# Patient Record
Sex: Female | Born: 1955 | Race: White | Hispanic: No | Smoking: Current every day smoker
Health system: Southern US, Community
[De-identification: ages and names within clinical notes are randomized; demographics above are authoritative.]

## PROBLEM LIST (undated history)

## (undated) DIAGNOSIS — T7840XA Allergy, unspecified, initial encounter: Secondary | ICD-10-CM

## (undated) DIAGNOSIS — N809 Endometriosis, unspecified: Secondary | ICD-10-CM

## (undated) HISTORY — PX: KNEE ARTHROSCOPY: SUR90

## (undated) HISTORY — DX: Allergy, unspecified, initial encounter: T78.40XA

## (undated) HISTORY — DX: Endometriosis, unspecified: N80.9

## (undated) HISTORY — PX: LAPAROSCOPY: SHX197

## (undated) HISTORY — PX: APPENDECTOMY: SHX54

---

## 1998-03-22 ENCOUNTER — Other Ambulatory Visit: Admission: RE | Admit: 1998-03-22 | Discharge: 1998-03-22 | Payer: Self-pay | Admitting: Obstetrics & Gynecology

## 1998-07-23 ENCOUNTER — Ambulatory Visit (HOSPITAL_COMMUNITY): Admission: RE | Admit: 1998-07-23 | Discharge: 1998-07-23 | Payer: Self-pay | Admitting: Obstetrics & Gynecology

## 1998-11-27 ENCOUNTER — Encounter: Payer: Self-pay | Admitting: Obstetrics & Gynecology

## 1998-11-27 ENCOUNTER — Ambulatory Visit (HOSPITAL_COMMUNITY): Admission: RE | Admit: 1998-11-27 | Discharge: 1998-11-27 | Payer: Self-pay | Admitting: Obstetrics & Gynecology

## 1999-09-04 ENCOUNTER — Other Ambulatory Visit: Admission: RE | Admit: 1999-09-04 | Discharge: 1999-09-04 | Payer: Self-pay | Admitting: Obstetrics & Gynecology

## 2000-03-18 ENCOUNTER — Encounter: Payer: Self-pay | Admitting: Obstetrics & Gynecology

## 2000-03-18 ENCOUNTER — Ambulatory Visit (HOSPITAL_COMMUNITY): Admission: RE | Admit: 2000-03-18 | Discharge: 2000-03-18 | Payer: Self-pay | Admitting: Obstetrics & Gynecology

## 2000-06-15 ENCOUNTER — Encounter: Payer: Self-pay | Admitting: Internal Medicine

## 2000-06-15 ENCOUNTER — Encounter: Admission: RE | Admit: 2000-06-15 | Discharge: 2000-06-15 | Payer: Self-pay | Admitting: Internal Medicine

## 2000-07-15 ENCOUNTER — Encounter: Admission: RE | Admit: 2000-07-15 | Discharge: 2000-07-15 | Payer: Self-pay | Admitting: Internal Medicine

## 2000-07-15 ENCOUNTER — Encounter: Payer: Self-pay | Admitting: Internal Medicine

## 2001-01-29 ENCOUNTER — Other Ambulatory Visit: Admission: RE | Admit: 2001-01-29 | Discharge: 2001-01-29 | Payer: Self-pay | Admitting: Obstetrics & Gynecology

## 2001-06-29 ENCOUNTER — Encounter: Payer: Self-pay | Admitting: Obstetrics & Gynecology

## 2001-06-29 ENCOUNTER — Ambulatory Visit (HOSPITAL_COMMUNITY): Admission: RE | Admit: 2001-06-29 | Discharge: 2001-06-29 | Payer: Self-pay | Admitting: Obstetrics & Gynecology

## 2002-02-18 ENCOUNTER — Other Ambulatory Visit: Admission: RE | Admit: 2002-02-18 | Discharge: 2002-02-18 | Payer: Self-pay | Admitting: Obstetrics and Gynecology

## 2003-01-02 ENCOUNTER — Encounter: Payer: Self-pay | Admitting: Obstetrics & Gynecology

## 2003-01-02 ENCOUNTER — Ambulatory Visit (HOSPITAL_COMMUNITY): Admission: RE | Admit: 2003-01-02 | Discharge: 2003-01-02 | Payer: Self-pay | Admitting: Obstetrics & Gynecology

## 2003-06-27 ENCOUNTER — Other Ambulatory Visit: Admission: RE | Admit: 2003-06-27 | Discharge: 2003-06-27 | Payer: Self-pay | Admitting: Obstetrics & Gynecology

## 2004-05-10 ENCOUNTER — Ambulatory Visit (HOSPITAL_COMMUNITY): Admission: RE | Admit: 2004-05-10 | Discharge: 2004-05-10 | Payer: Self-pay | Admitting: Obstetrics & Gynecology

## 2004-07-22 ENCOUNTER — Other Ambulatory Visit: Admission: RE | Admit: 2004-07-22 | Discharge: 2004-07-22 | Payer: Self-pay | Admitting: Obstetrics & Gynecology

## 2004-11-15 ENCOUNTER — Encounter (INDEPENDENT_AMBULATORY_CARE_PROVIDER_SITE_OTHER): Payer: Self-pay | Admitting: Specialist

## 2004-11-15 ENCOUNTER — Ambulatory Visit (HOSPITAL_COMMUNITY): Admission: RE | Admit: 2004-11-15 | Discharge: 2004-11-15 | Payer: Self-pay | Admitting: Gastroenterology

## 2006-01-20 ENCOUNTER — Other Ambulatory Visit: Admission: RE | Admit: 2006-01-20 | Discharge: 2006-01-20 | Payer: Self-pay | Admitting: Obstetrics & Gynecology

## 2007-10-20 ENCOUNTER — Encounter: Payer: Self-pay | Admitting: Internal Medicine

## 2007-10-27 ENCOUNTER — Encounter: Payer: Self-pay | Admitting: Internal Medicine

## 2007-12-09 ENCOUNTER — Encounter: Payer: Self-pay | Admitting: Internal Medicine

## 2007-12-17 ENCOUNTER — Ambulatory Visit: Payer: Self-pay | Admitting: Internal Medicine

## 2007-12-17 DIAGNOSIS — J309 Allergic rhinitis, unspecified: Secondary | ICD-10-CM | POA: Insufficient documentation

## 2007-12-17 LAB — CONVERTED CEMR LAB
Basophils Absolute: 0.1 10*3/uL (ref 0.0–0.1)
Basophils Relative: 1.6 % — ABNORMAL HIGH (ref 0.0–1.0)
Eosinophils Absolute: 0.3 10*3/uL (ref 0.0–0.6)
HCT: 38.4 % (ref 36.0–46.0)
Hemoglobin: 12.7 g/dL (ref 12.0–15.0)
MCHC: 33.1 g/dL (ref 30.0–36.0)
Monocytes Relative: 6 % (ref 3.0–11.0)
Neutro Abs: 5.4 10*3/uL (ref 1.4–7.7)
Platelets: 338 10*3/uL (ref 150–400)
WBC: 8.1 10*3/uL (ref 4.5–10.5)

## 2007-12-19 DIAGNOSIS — J329 Chronic sinusitis, unspecified: Secondary | ICD-10-CM | POA: Insufficient documentation

## 2007-12-29 ENCOUNTER — Ambulatory Visit: Payer: Self-pay | Admitting: Internal Medicine

## 2008-01-05 ENCOUNTER — Telehealth: Payer: Self-pay | Admitting: Internal Medicine

## 2008-02-01 ENCOUNTER — Ambulatory Visit (HOSPITAL_COMMUNITY): Admission: RE | Admit: 2008-02-01 | Discharge: 2008-02-01 | Payer: Self-pay | Admitting: Gastroenterology

## 2010-05-03 ENCOUNTER — Encounter: Admission: RE | Admit: 2010-05-03 | Discharge: 2010-05-03 | Payer: Self-pay | Admitting: Internal Medicine

## 2010-07-22 ENCOUNTER — Ambulatory Visit (HOSPITAL_COMMUNITY): Admission: RE | Admit: 2010-07-22 | Discharge: 2010-07-22 | Payer: Self-pay | Admitting: Obstetrics & Gynecology

## 2011-06-26 ENCOUNTER — Other Ambulatory Visit (HOSPITAL_COMMUNITY): Payer: Self-pay | Admitting: Obstetrics & Gynecology

## 2011-06-26 DIAGNOSIS — Z1231 Encounter for screening mammogram for malignant neoplasm of breast: Secondary | ICD-10-CM

## 2011-07-24 ENCOUNTER — Ambulatory Visit (HOSPITAL_COMMUNITY)
Admission: RE | Admit: 2011-07-24 | Discharge: 2011-07-24 | Disposition: A | Discharge status: Home / Self Care | Payer: BC Managed Care – PPO | Source: Ambulatory Visit | Attending: Obstetrics & Gynecology | Admitting: Obstetrics & Gynecology

## 2011-07-24 DIAGNOSIS — Z1231 Encounter for screening mammogram for malignant neoplasm of breast: Secondary | ICD-10-CM | POA: Insufficient documentation

## 2011-08-14 ENCOUNTER — Other Ambulatory Visit: Payer: Self-pay | Admitting: Internal Medicine

## 2011-08-14 ENCOUNTER — Ambulatory Visit
Admission: RE | Admit: 2011-08-14 | Discharge: 2011-08-14 | Disposition: A | Discharge status: Home / Self Care | Payer: BC Managed Care – PPO | Source: Ambulatory Visit | Attending: Internal Medicine | Admitting: Internal Medicine

## 2011-08-14 DIAGNOSIS — R52 Pain, unspecified: Secondary | ICD-10-CM

## 2011-11-20 ENCOUNTER — Ambulatory Visit (INDEPENDENT_AMBULATORY_CARE_PROVIDER_SITE_OTHER): Payer: BC Managed Care – PPO | Admitting: Family Medicine

## 2011-11-20 DIAGNOSIS — B9789 Other viral agents as the cause of diseases classified elsewhere: Secondary | ICD-10-CM

## 2011-11-20 DIAGNOSIS — B349 Viral infection, unspecified: Secondary | ICD-10-CM

## 2011-11-20 DIAGNOSIS — J45909 Unspecified asthma, uncomplicated: Secondary | ICD-10-CM

## 2011-11-20 DIAGNOSIS — J683 Other acute and subacute respiratory conditions due to chemicals, gases, fumes and vapors: Secondary | ICD-10-CM

## 2011-11-20 MED ORDER — AZITHROMYCIN 250 MG PO TABS: ORAL_TABLET | ORAL | Status: AC

## 2011-11-20 MED ORDER — FLUTICASONE PROPIONATE 50 MCG/ACT NA SUSP: 2.0000 | Freq: Every day | NASAL | Status: DC

## 2011-11-20 MED ORDER — BENZONATATE 100 MG PO CAPS: 200.0000 mg | ORAL_CAPSULE | Freq: Two times a day (BID) | ORAL | Status: AC | PRN

## 2011-11-20 NOTE — Progress Notes (Deleted)
  Subjective:    Patient ID: Natasha Johnson, female    DOB: [birth year], 56 y.o.   MRN: [id number]  Cough      Review of Systems  Respiratory: Positive for cough.        Objective:   Physical Exam        Assessment & Plan:

## 2011-11-20 NOTE — Progress Notes (Signed)
  Subjective:    Patient ID: Natasha Johnson, female    DOB: 03-18-1956, 56 y.o.   MRN: [id number]  HPI  Patient presents with 5 days history of low grade fever, chills, copious rhinorrhea and cough. Cough is worse at night.   PMH/ Recurrent sinusitis           Allergies  Review of Systems  Constitutional: Negative for fever and chills.  HENT: Positive for congestion and rhinorrhea.   Respiratory: Positive for cough. Negative for wheezing.        Objective:   Physical Exam  Constitutional: She is oriented to person, place, and time. She appears well-developed and well-nourished.  HENT:  Head: Normocephalic and atraumatic.  Nose: Rhinorrhea present.  Mouth/Throat: Posterior oropharyngeal erythema present.  Neck: Neck supple.  Pulmonary/Chest: Effort normal and breath sounds normal.  Neurological: She is alert and oriented to person, place, and time.  Skin: Skin is warm.          Assessment & Plan:   1. Infection viral    2. Reactive airways disease  azithromycin (ZITHROMAX) 250 MG tablet, fluticasone (FLONASE) 50 MCG/ACT nasal spray, benzonatate (TESSALON) 100 MG capsule   Anticipatory guidance

## 2012-02-27 ENCOUNTER — Other Ambulatory Visit: Payer: Self-pay | Admitting: Family Medicine

## 2012-02-27 DIAGNOSIS — J683 Other acute and subacute respiratory conditions due to chemicals, gases, fumes and vapors: Secondary | ICD-10-CM

## 2012-02-27 MED ORDER — FLUTICASONE PROPIONATE 50 MCG/ACT NA SUSP: 2.0000 | Freq: Every day | NASAL | Status: DC

## 2012-03-01 ENCOUNTER — Other Ambulatory Visit: Payer: Self-pay | Admitting: Physician Assistant

## 2012-03-01 DIAGNOSIS — J683 Other acute and subacute respiratory conditions due to chemicals, gases, fumes and vapors: Secondary | ICD-10-CM

## 2012-03-01 MED ORDER — FLUTICASONE PROPIONATE 50 MCG/ACT NA SUSP: 2.0000 | Freq: Every day | NASAL | Status: DC

## 2013-01-28 ENCOUNTER — Other Ambulatory Visit: Payer: Self-pay | Admitting: Obstetrics & Gynecology

## 2013-01-28 DIAGNOSIS — R928 Other abnormal and inconclusive findings on diagnostic imaging of breast: Secondary | ICD-10-CM

## 2013-02-08 ENCOUNTER — Ambulatory Visit
Admission: RE | Admit: 2013-02-08 | Discharge: 2013-02-08 | Disposition: A | Discharge status: Home / Self Care | Payer: BC Managed Care – PPO | Source: Ambulatory Visit | Attending: Obstetrics & Gynecology | Admitting: Obstetrics & Gynecology

## 2013-02-08 DIAGNOSIS — R928 Other abnormal and inconclusive findings on diagnostic imaging of breast: Secondary | ICD-10-CM

## 2013-07-05 ENCOUNTER — Other Ambulatory Visit: Payer: Self-pay | Admitting: Obstetrics & Gynecology

## 2013-07-05 DIAGNOSIS — N644 Mastodynia: Secondary | ICD-10-CM

## 2013-07-20 ENCOUNTER — Ambulatory Visit
Admission: RE | Admit: 2013-07-20 | Discharge: 2013-07-20 | Disposition: A | Discharge status: Home / Self Care | Payer: 59 | Source: Ambulatory Visit | Attending: Obstetrics & Gynecology | Admitting: Obstetrics & Gynecology

## 2013-07-20 DIAGNOSIS — N644 Mastodynia: Secondary | ICD-10-CM

## 2013-08-10 ENCOUNTER — Ambulatory Visit (INDEPENDENT_AMBULATORY_CARE_PROVIDER_SITE_OTHER): Payer: 59 | Admitting: Podiatry

## 2013-08-10 ENCOUNTER — Encounter: Payer: Self-pay | Admitting: Podiatry

## 2013-08-10 VITALS — BP 132/75 | HR 67 | Ht 63.75 in | Wt 135.0 lb

## 2013-08-10 DIAGNOSIS — M21969 Unspecified acquired deformity of unspecified lower leg: Secondary | ICD-10-CM | POA: Insufficient documentation

## 2013-08-10 DIAGNOSIS — M79609 Pain in unspecified limb: Secondary | ICD-10-CM

## 2013-08-10 DIAGNOSIS — M79674 Pain in right toe(s): Secondary | ICD-10-CM

## 2013-08-10 MED ORDER — ARCH BANDAGE MISC: 1.0000 | Freq: Once | Status: AC

## 2013-08-10 NOTE — Progress Notes (Signed)
Subjective: 57 year old female presents complaining of pain on right great toe x 1 month. Initially it was very painful and was like a soft cyst. Now it is more firm and less painful. She also had some medial TNJ area pain two times since last year. Stated that she exercises in Zumba class.   Review of Systems - General ROS: negative for - chills, fatigue, fever, night sweats, sleep disturbance, weight gain or weight loss Ophthalmic ROS: negative ENT ROS: negative Allergy and Immunology ROS: Seasonal. Takes Alegra that helps. Breast ROS: negative Respiratory ROS: no cough, shortness of breath, or wheezing Cardiovascular ROS: no chest pain or dyspnea on exertion Gastrointestinal ROS: no abdominal pain, change in bowel habits, or black or bloody stools Musculoskeletal ROS: negative other than foot pain. Neurological ROS: negative Dermatological ROS: Has dry skin all over.   Objective: Vascular status are within normal. No edema or erythema noted.  Right great toe affected area show no redness or visible edema and is similar in texture and size when compared with the contralateral side.  Dermatologic: Normotrophic skin without visible skin lesions. Orthopedic: Significant weakness and dorsal displacement of the first Metatarsal bone upon loading of forefoot R>L. Positive of contracting lesser digits in stance position right>left. Hyper-pronating Subtalar joint upon weight bearing R>L.  Neurologic: All epicritic and tactile sensations grossly intact.   Assessment: Hypermobile first ray with unstable first Metatarsocuneiform joint right>left. Excess gripping of the 2nd digit upon the lateral aspect of the the right hallux, pressing on lateral side of the hallux.  Faulty biomechanics, Subtalar joint hyper pronation with weak first ray and contracting lesser digits.  Plan: Reviewed clinical findings and available treatment options.  Metatarsal binder dispensed to provide added stability of the  first ray, and coban wrap of the right hallux to prevent it from being squeezed by the 2nd digit during weight bearing.  Orthotics will also benefit to address abnormal biomechanics of lower limbs.  Patient will return for the orthotics.

## 2013-08-10 NOTE — Patient Instructions (Signed)
Seen for right great toe pain. Noted of unstable first Metatarsal bone.  May benefit from Metatarsal binder to stabilize the base of the first metatarsal bone and custom made orthotics.  Will contact patient to prepare for orthotics.

## 2013-09-03 ENCOUNTER — Ambulatory Visit (INDEPENDENT_AMBULATORY_CARE_PROVIDER_SITE_OTHER): Payer: 59 | Admitting: Physician Assistant

## 2013-09-03 VITALS — BP 120/74 | HR 79 | Temp 98.4°F | Resp 16 | Ht 63.5 in | Wt 138.0 lb

## 2013-09-03 DIAGNOSIS — R05 Cough: Secondary | ICD-10-CM

## 2013-09-03 DIAGNOSIS — R059 Cough, unspecified: Secondary | ICD-10-CM

## 2013-09-03 DIAGNOSIS — J329 Chronic sinusitis, unspecified: Secondary | ICD-10-CM

## 2013-09-03 MED ORDER — HYDROCOD POLST-CHLORPHEN POLST 10-8 MG/5ML PO LQCR: 5.0000 mL | Freq: Two times a day (BID) | ORAL | Status: DC | PRN

## 2013-09-03 MED ORDER — IPRATROPIUM BROMIDE 0.03 % NA SOLN: 2.0000 | Freq: Two times a day (BID) | NASAL | Status: DC

## 2013-09-03 MED ORDER — CLARITHROMYCIN ER 500 MG PO TB24: 1000.0000 mg | ORAL_TABLET | Freq: Every day | ORAL | Status: DC

## 2013-09-03 NOTE — Patient Instructions (Signed)
Get plenty of rest and drink at least 64 ounces of water daily. Continue the Mucinex. 

## 2013-09-03 NOTE — Progress Notes (Signed)
   Subjective:    Patient ID: Natasha Johnson, female    DOB: 20-Jul-1956, 57 y.o.   MRN: [id number]  PCP: Alva Garnet., MD  Chief Complaint  Patient presents with  . Cough    all started Tuesday  . Headache  . Fever    low-grade  . Generalized Body Aches   Medications, allergies, past medical history, surgical history, family history, social history and problem list reviewed and updated.   HPI  All summer I had a lot of mucous and allergies.  With an insurance change, the medication she was taking for it became unaffordable.  ALlegra wasn't very helpful.  In October at her CPE, she received a dose of Kenalog IM with good results.  2 weeks ago she was exposed to CO due to a leak in the fireplace.  On 08/30/2013, she developed a ST, drainage, generally felt bad.  Then improved for a day or so.  Yesterday, started feeling bad again. Seemed to come on fast-runny nose, congestion, laryngitis, "feeling like crap again."  Did not receive a flu vaccine this season, "And I don't want one."  Review of Systems As above.    Objective:   Physical Exam Blood pressure 120/74, pulse 79, temperature 98.4 F (36.9 C), temperature source Oral, resp. rate 16, height 5' 3.5" (1.613 m), weight 138 lb (62.596 kg), SpO2 96.00%. Body mass index is 24.06 kg/(m^2). Well-developed, well nourished WF who is awake, alert and oriented, in NAD. HEENT: [place]/AT, PERRL, EOMI.  Sclera and conjunctiva are clear.  EAC are patent, TMs are normal in appearance. Nasal mucosa is pink and moist. OP is clear. Neck: supple, non-tender, no lymphadenopathy, thyromegaly. Heart: RRR, no murmur Lungs: normal effort, CTA; cough during exam is non-productive. Extremities: no cyanosis, clubbing or edema. Skin: warm and dry without rash. Psychologic: good mood and appropriate affect, normal speech and behavior.   Results for orders placed in visit on 09/03/13  POCT INFLUENZA A/B      Result Value Range   Influenza A, POC Negative     Influenza B, POC Negative          Assessment & Plan:  1. Sinusitis Rest, fluids. Anticipatory guidance.  Continue Mucinex. RTC or see PCP if symptoms worsen/persist. - POCT Influenza A/B - clarithromycin (BIAXIN XL) 500 MG 24 hr tablet; Take 2 tablets (1,000 mg total) by mouth daily.  Dispense: 20 tablet; Refill: 0 - ipratropium (ATROVENT) 0.03 % nasal spray; Place 2 sprays into the nose 2 (two) times daily.  Dispense: 30 mL; Refill: 0  2. Cough - chlorpheniramine-HYDROcodone (TUSSIONEX PENNKINETIC ER) 10-8 MG/5ML LQCR; Take 5 mLs by mouth every 12 (twelve) hours as needed for cough (cough).  Dispense: 100 mL; Refill: 0   Fernande Bras, PA-C Physician Assistant-Certified [facility]

## 2013-09-08 ENCOUNTER — Ambulatory Visit
Admission: RE | Admit: 2013-09-08 | Discharge: 2013-09-08 | Disposition: A | Discharge status: Home / Self Care | Payer: 59 | Source: Ambulatory Visit | Attending: Internal Medicine | Admitting: Internal Medicine

## 2013-09-08 ENCOUNTER — Other Ambulatory Visit: Payer: Self-pay | Admitting: Internal Medicine

## 2013-09-08 DIAGNOSIS — R0602 Shortness of breath: Secondary | ICD-10-CM

## 2013-12-19 ENCOUNTER — Ambulatory Visit: Payer: 59

## 2013-12-19 ENCOUNTER — Ambulatory Visit (INDEPENDENT_AMBULATORY_CARE_PROVIDER_SITE_OTHER): Payer: 59 | Admitting: Family Medicine

## 2013-12-19 VITALS — BP 138/84 | HR 77 | Temp 98.4°F | Resp 16 | Ht 63.5 in | Wt 140.0 lb

## 2013-12-19 DIAGNOSIS — R079 Chest pain, unspecified: Secondary | ICD-10-CM

## 2013-12-19 DIAGNOSIS — R631 Polydipsia: Secondary | ICD-10-CM

## 2013-12-19 DIAGNOSIS — R0781 Pleurodynia: Secondary | ICD-10-CM

## 2013-12-19 DIAGNOSIS — R3589 Other polyuria: Secondary | ICD-10-CM

## 2013-12-19 DIAGNOSIS — R42 Dizziness and giddiness: Secondary | ICD-10-CM

## 2013-12-19 DIAGNOSIS — R358 Other polyuria: Secondary | ICD-10-CM

## 2013-12-19 DIAGNOSIS — T148XXA Other injury of unspecified body region, initial encounter: Secondary | ICD-10-CM

## 2013-12-19 LAB — POCT CBC
Granulocyte percent: 73.7 %G (ref 37–80)
HCT, POC: 41 % (ref 37.7–47.9)
Hemoglobin: 13.2 g/dL (ref 12.2–16.2)
Lymph, poc: 1.8 (ref 0.6–3.4)
MCH, POC: 30.3 pg (ref 27–31.2)
MCHC: 32.2 g/dL (ref 31.8–35.4)
MCV: 94 fL (ref 80–97)
MID (cbc): 0.5 (ref 0–0.9)
MPV: 7.9 fL (ref 0–99.8)
POC Granulocyte: 6.3 (ref 2–6.9)
POC LYMPH PERCENT: 21 %L (ref 10–50)
POC MID %: 5.3 %M (ref 0–12)
Platelet Count, POC: 289 10*3/uL (ref 142–424)
RBC: 4.36 M/uL (ref 4.04–5.48)
RDW, POC: 13.4 %
WBC: 8.6 10*3/uL (ref 4.6–10.2)

## 2013-12-19 LAB — COMPREHENSIVE METABOLIC PANEL
ALT: 14 U/L (ref 0–35)
AST: 18 U/L (ref 0–37)
Albumin: 4.7 g/dL (ref 3.5–5.2)
Alkaline Phosphatase: 101 U/L (ref 39–117)
BUN: 9 mg/dL (ref 6–23)
CO2: 29 mEq/L (ref 19–32)
Calcium: 9.6 mg/dL (ref 8.4–10.5)
Chloride: 99 mEq/L (ref 96–112)
Creat: 0.62 mg/dL (ref 0.50–1.10)
Glucose, Bld: 105 mg/dL — ABNORMAL HIGH (ref 70–99)
Potassium: 4 mEq/L (ref 3.5–5.3)
Sodium: 139 mEq/L (ref 135–145)
Total Bilirubin: 0.5 mg/dL (ref 0.2–1.2)
Total Protein: 7.2 g/dL (ref 6.0–8.3)

## 2013-12-19 LAB — POCT UA - MICROSCOPIC ONLY
Bacteria, U Microscopic: NEGATIVE
Casts, Ur, LPF, POC: NEGATIVE
Crystals, Ur, HPF, POC: NEGATIVE
Epithelial cells, urine per micros: NEGATIVE
Mucus, UA: NEGATIVE
WBC, Ur, HPF, POC: NEGATIVE
Yeast, UA: NEGATIVE

## 2013-12-19 LAB — POCT URINALYSIS DIPSTICK
Bilirubin, UA: NEGATIVE
Glucose, UA: NEGATIVE
Ketones, UA: NEGATIVE
Leukocytes, UA: NEGATIVE
Nitrite, UA: NEGATIVE
Protein, UA: NEGATIVE
Spec Grav, UA: 1.01
Urobilinogen, UA: 0.2
pH, UA: 7

## 2013-12-19 LAB — POCT GLYCOSYLATED HEMOGLOBIN (HGB A1C): Hemoglobin A1C: 5.1

## 2013-12-19 LAB — GLUCOSE, POCT (MANUAL RESULT ENTRY): POC Glucose: 104 mg/dL — AB (ref 70–99)

## 2013-12-19 NOTE — Progress Notes (Signed)
Chief Complaint:  Chief Complaint  Patient presents with  . Rib Injury    Right ribs-herad a pop Fri while working out and it's been hurting ever since  . Dizziness    when switching positions x 3 weeks    HPI: Natasha Johnson is a 58 y.o. female who is here for: 1.  Right rib pain x 2-3 days, she was in the gym adn was laying down and pushing up she heard her ribs pop and now it is very sore. She has not been coughin too much but she has had some coughing and sinus draiange. No history of osteoporosis. No prior broken bones.  She has tried advil with some releif. She has no SOB but she has pleuritic CP with deep breaths  2. She has ahd dizziness for the last 3 weeks. Starts only when she gets up out of bed. She is working out and she gets dizzy. But sh only gets it when she is standing  Up or getting from seated position to standing position. She does not have HA/n/v/vision changes, CP, palpitations with this or with ROM of her head. She is not confused or have slurred speech.   Past Medical History  Diagnosis Date  . Allergy   . Endometriosis    Past Surgical History  Procedure Laterality Date  . Appendectomy    . Knee arthroscopy Left   . Laparoscopy      endometriosis   History   Social History  . Marital Status: Significant Other    Spouse Name: Alecia Lemming    Number of Children: 0  . Years of Education: College   Occupational History  . RESEARCH    Social History Main Topics  . Smoking status: Former Games developer  . Smokeless tobacco: Never Used  . Alcohol Use: 1.8 - 2.4 oz/week    3-4 Glasses of wine per week     Comment: Red Wine  . Drug Use: No  . Sexual Activity: None   Other Topics Concern  . None   Social History Narrative   Lives with Alecia Lemming.   Family History  Problem Relation Age of Onset  . Hypertension Father    Allergies  Allergen Reactions  . Ciprocin-Fluocin-Procin [Fluocinolone Acetonide] Hives  . Penicillins   . Sulfonamide  Derivatives    Prior to Admission medications   Medication Sig Start Date End Date Taking? Authorizing Provider  ALPRAZolam Prudy Feeler) 0.5 MG tablet  07/31/13  Yes Historical Provider, MD  benzonatate (TESSALON) 100 MG capsule  07/14/13  Yes Historical Provider, MD  Multiple Vitamins-Minerals (MULTIVITAMIN WITH MINERALS) tablet Take 1 tablet by mouth daily.   Yes Historical Provider, MD  sertraline (ZOLOFT) 20 MG/ML concentrated solution Take 25 mg by mouth daily.   Yes Historical Provider, MD  ursodiol (ACTIGALL) 250 MG tablet Take 250 mg by mouth 3 (three) times daily.   Yes Historical Provider, MD     ROS: The patient denies fevers, chills, night sweats, unintentional weight loss, chest pain, palpitations, wheezing, dyspnea on exertion, nausea, vomiting, abdominal pain, dysuria, hematuria, melena, numbness, weakness, or tingling.   All other systems have been reviewed and were otherwise negative with the exception of those mentioned in the HPI and as above.    PHYSICAL EXAM: Filed Vitals:   12/19/13 1419  BP: 138/84  Pulse: 77  Temp:   Resp:    Filed Vitals:   12/19/13 1334  Height: 5' 3.5" (1.613 m)  Weight: 140  lb (63.504 kg)   Body mass index is 24.41 kg/(m^2).  General: Alert, no acute distress HEENT:  Normocephalic, atraumatic, oropharynx patent. EOMI, PERRLA Cardiovascular:  Regular rate and rhythm, no rubs murmurs or gallops.  No Carotid bruits, radial pulse intact. No pedal edema.  Respiratory: Clear to auscultation bilaterally.  No wheezes, rales, or rhonchi.  No cyanosis, no use of accessory musculature GI: No organomegaly, abdomen is soft and non-tender, positive bowel sounds.  No masses. Skin: No rashes. Neurologic: Facial musculature symmetric. Psychiatric: Patient is appropriate throughout our interaction. Lymphatic: No cervical lymphadenopathy Musculoskeletal: Gait intact. + tenderness on lower right ribs  LABS: Results for orders placed in visit on 12/19/13    POCT CBC      Result Value Ref Range   WBC 8.6  4.6 - 10.2 K/uL   Lymph, poc 1.8  0.6 - 3.4   POC LYMPH PERCENT 21.0  10 - 50 %L   MID (cbc) 0.5  0 - 0.9   POC MID % 5.3  0 - 12 %M   POC Granulocyte 6.3  2 - 6.9   Granulocyte percent 73.7  37 - 80 %G   RBC 4.36  4.04 - 5.48 M/uL   Hemoglobin 13.2  12.2 - 16.2 g/dL   HCT, POC 78.241.0  95.637.7 - 47.9 %   MCV 94.0  80 - 97 fL   MCH, POC 30.3  27 - 31.2 pg   MCHC 32.2  31.8 - 35.4 g/dL   RDW, POC 21.313.4     Platelet Count, POC 289  142 - 424 K/uL   MPV 7.9  0 - 99.8 fL  POCT UA - MICROSCOPIC ONLY      Result Value Ref Range   WBC, Ur, HPF, POC neg     RBC, urine, microscopic 0-1     Bacteria, U Microscopic neg     Mucus, UA neg     Epithelial cells, urine per micros neg     Crystals, Ur, HPF, POC neg     Casts, Ur, LPF, POC neg     Yeast, UA neg    POCT URINALYSIS DIPSTICK      Result Value Ref Range   Color, UA lt yellow     Clarity, UA clear     Glucose, UA neg     Bilirubin, UA neg     Ketones, UA neg     Spec Grav, UA 1.010     Blood, UA trace-lysed     pH, UA 7.0     Protein, UA neg     Urobilinogen, UA 0.2     Nitrite, UA neg     Leukocytes, UA Negative    POCT GLYCOSYLATED HEMOGLOBIN (HGB A1C)      Result Value Ref Range   Hemoglobin A1C 5.1    GLUCOSE, POCT (MANUAL RESULT ENTRY)      Result Value Ref Range   POC Glucose 104 (*) 70 - 99 mg/dl     EKG/XRAY:   Primary read [facility]. Abnormal ribs unchanged from prior xays but uncertain if acute fracture present Chest neg for pneumothorax, no effusion or infiltrates   ASSESSMENT/PLAN: Encounter Diagnoses  Name Primary?  . Rib pain on right side Yes  . Polydipsia   . Polyuria   . Dizziness and giddiness   . Contusion of unspecified site    Orthostatics nl Labs in house normal Postural Hypotension, precautiosn given avoid sudafed for time being Sample  of nasonex given Will f/u with send out labs Will await for official xray results    Gross sideeffects, risk and benefits, and alternatives of medications d/w patient. Patient is aware that all medications have potential sideeffects and we are unable to predict every sideeffect or drug-drug interaction that may occur.  Treniyah Lynn PHUONG, DO 12/19/2013 3:11 PM  Left message regarding xray results.  If she wants something stronger than tylenol or ibuprofen then can write for norco   CLINICAL DATA: Right rib pain.  EXAM:  RIGHT RIBS - 2 VIEW  COMPARISON: Chest x-ray 09/08/2013.  FINDINGS:  The cardiac silhouette, mediastinal and hilar contours are within  normal limits and stable. There are chronic lung changes with  probable emphysema and pulmonary scarring. No infiltrates, edema or  effusions. There is a nondisplaced fracture involving the anterior  aspect of the eighth rib. No other definite acute rib fractures.  IMPRESSION:  No acute cardiopulmonary findings.  Right eighth anterior rib fracture.

## 2013-12-21 ENCOUNTER — Telehealth: Payer: Self-pay

## 2013-12-21 DIAGNOSIS — S2249XA Multiple fractures of ribs, unspecified side, initial encounter for closed fracture: Secondary | ICD-10-CM

## 2013-12-21 NOTE — Telephone Encounter (Signed)
Spoke to patient about labs, xrays and also what she needs to do for rib fracture. Will order bone density scan.

## 2013-12-21 NOTE — Telephone Encounter (Signed)
PT STATES SHE WAS SEEN FOR A RIB FRACTURE BUT WASN'T GIVEN ANY INSTRUCTIONS AS HOW TO TAKE CARE OF IT OR WHAT TO DO.  PLEASE CALL [phone]

## 2013-12-26 ENCOUNTER — Telehealth: Payer: Self-pay

## 2013-12-26 NOTE — Telephone Encounter (Signed)
Seen by Dr. Conley RollsLe; will forward to Dr. Conley RollsLe.

## 2013-12-26 NOTE — Telephone Encounter (Signed)
Patient Responses     Chl Mychart After Visit Questionnaire    Question 12/26/2013 3:58 PM   How are you feeling after your recent visit? Not good-fractured rib   Does the recommended course of treatment seem to be helping your symptoms? There is no real treatment   Are you experiencing any side effects from your recommended treatment?    is there anything else you would like to ask your physician? I don't like tylenol. is there another good pain pill that would help.

## 2014-01-01 ENCOUNTER — Other Ambulatory Visit: Payer: Self-pay | Admitting: Family Medicine

## 2014-01-01 MED ORDER — HYDROCODONE-ACETAMINOPHEN 5-325 MG PO TABS: 1.0000 | ORAL_TABLET | Freq: Three times a day (TID) | ORAL | Status: DC | PRN

## 2014-01-01 NOTE — Telephone Encounter (Signed)
Pt.notified

## 2014-01-01 NOTE — Telephone Encounter (Signed)
Can you call her and let her know that I will have a prescription for Norco for her to pick up after 10 am on 01/01/14. It has tylenol in it along with a codeine derivate. She can take it every 8 hrs as needed, no other tylenol with this medication. Take ibuprofen prn and I also suggest that she can take a xanax since she already has if needed for relaxation/muscle spasm. I do not want to overmedicate her. If she needs additional muscle relaxer then I can give it to her. Major Sideeffects for norco include stomach upset, constipation and also CNS and  respiratory depression among other things. If she has a substance abuse issues then this is probably not a great drug for her. Thanks Tle

## 2014-03-17 ENCOUNTER — Ambulatory Visit (INDEPENDENT_AMBULATORY_CARE_PROVIDER_SITE_OTHER): Payer: 59 | Admitting: Family Medicine

## 2014-03-17 VITALS — BP 128/82 | HR 78 | Temp 98.4°F | Resp 16 | Ht 63.5 in | Wt 141.2 lb

## 2014-03-17 DIAGNOSIS — R0781 Pleurodynia: Secondary | ICD-10-CM

## 2014-03-17 DIAGNOSIS — R059 Cough, unspecified: Secondary | ICD-10-CM

## 2014-03-17 DIAGNOSIS — R05 Cough: Secondary | ICD-10-CM

## 2014-03-17 DIAGNOSIS — R079 Chest pain, unspecified: Secondary | ICD-10-CM

## 2014-03-17 MED ORDER — AZITHROMYCIN 250 MG PO TABS: ORAL_TABLET | ORAL | Status: DC

## 2014-03-17 MED ORDER — HYDROCODONE-HOMATROPINE 5-1.5 MG/5ML PO SYRP: 5.0000 mL | ORAL_SOLUTION | ORAL | Status: DC | PRN

## 2014-03-17 MED ORDER — BENZONATATE 100 MG PO CAPS: 100.0000 mg | ORAL_CAPSULE | Freq: Three times a day (TID) | ORAL | Status: DC | PRN

## 2014-03-17 NOTE — Progress Notes (Signed)
Subjective: Patient has been sick for several days with a head congestion, sore throat, and persistent cough. She had a rib fracture several months ago, and is very reluctant to keep on coughing like this for fear that she will reenter the rib. She is continued to have some pain in the lateral chest wall on the right. She does not smoke. She works at a computer all day. She never got her bone density scan done. For some reason she never got word as to what it was to be done. She will get one at her GYN appointment in 2 weeks.  Objective: Pleasant lady in no major distress except she is sore in her chest wall especially cough. Her TMs are normal. Throat erythematous without exudate. Neck supple without any significant nodes. Chest clear to auscultation. Mild right lateral chest wall tenderness. Heart regular without murmurs.  Assessment: URI and cough Chest wall pain Recent fractured ribs  Plan: Azithromycin, Hycodan, and Tessalon. Return if not improving.

## 2014-03-17 NOTE — Patient Instructions (Signed)
Drink plenty of fluids to help keep your secretions TM.   Take the azithromycin 2 pills today, then one daily for 4 days  Take the Tessalon one or 2 pills 3 times daily as needed for cough. This is generally not very sedating.  Use the hydrocodone cough syrup 1 teaspoon every 4-6 hours as needed for cough, does tend to sedate  If getting worse such as more short of breath, more pain, or more fever please return

## 2014-10-05 ENCOUNTER — Ambulatory Visit: Payer: 59 | Admitting: Podiatry

## 2014-10-17 ENCOUNTER — Ambulatory Visit (INDEPENDENT_AMBULATORY_CARE_PROVIDER_SITE_OTHER): Payer: 59

## 2014-10-17 ENCOUNTER — Ambulatory Visit (INDEPENDENT_AMBULATORY_CARE_PROVIDER_SITE_OTHER): Payer: 59 | Admitting: Podiatry

## 2014-10-17 VITALS — BP 140/85 | HR 72 | Resp 16 | Ht 63.75 in | Wt 145.0 lb

## 2014-10-17 DIAGNOSIS — M9271 Juvenile osteochondrosis of metatarsus, right foot: Secondary | ICD-10-CM

## 2014-10-17 DIAGNOSIS — S90121A Contusion of right lesser toe(s) without damage to nail, initial encounter: Secondary | ICD-10-CM

## 2014-10-17 NOTE — Progress Notes (Signed)
   Subjective:    Patient ID: Natasha Johnson, female    DOB: [birth year], 59 y.o.   MRN: [id number]  HPI Comments: Injured the second toe on the right foot, it has been xrayed twice and had a ct scan and they can not find anything wrong with it , tripped on a throw rug and hit the toe into a chair on July 13th      Review of Systems  All other systems reviewed and are negative.      Objective:   Physical Exam: I have reviewed her past mental history medications allergy surgery social history and review of systems. Pulses are strongly palpable bilateral. Neurologic sensorium is intact per Semmes Weinstein monofilament. Deep tendon reflexes are intact bilateral and muscle strength was 5 over 5 dorsiflexion plantar flexors and inverters everters all into the musculature is intact. [facility] joints distal to the ankle have a full range of motion without crepitation. She has pain on palpation and range of motion of the second metatarsophalangeal joint of the right foot which does demonstrate some mild edema and thickness in the area. Radiographic evaluation does demonstrate a Freiberg's fracture also a compression fracture of the PIPJ second digit right. I also evaluated her second metatarsophalangeal joint on CT scan.        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  Plan: Discussed etiology pathology conservative versus surgical therapies. Discussed arthroplasty with interposition of the tendon for surgical correction. I also offered her an injection at the metatarsophalangeal joint which she took advantage of today with dexamethasone and local anesthetic. This was prepped with Betadine and local anesthetic and I will follow-up with her in 1 month.

## 2014-10-31 ENCOUNTER — Ambulatory Visit (INDEPENDENT_AMBULATORY_CARE_PROVIDER_SITE_OTHER): Payer: 59 | Admitting: Podiatry

## 2014-10-31 VITALS — BP 127/85 | HR 84 | Resp 16

## 2014-10-31 DIAGNOSIS — M76821 Posterior tibial tendinitis, right leg: Secondary | ICD-10-CM

## 2014-10-31 DIAGNOSIS — M6789 Other specified disorders of synovium and tendon, multiple sites: Secondary | ICD-10-CM

## 2014-10-31 DIAGNOSIS — M9271 Juvenile osteochondrosis of metatarsus, right foot: Secondary | ICD-10-CM

## 2014-10-31 DIAGNOSIS — M76829 Posterior tibial tendinitis, unspecified leg: Secondary | ICD-10-CM

## 2014-10-31 NOTE — Progress Notes (Signed)
She presents today after having injured her right ankle numerous times over the years. She states that the right ankle appears to be weak and wants to roll to the outside she states that she has pain overlying the anterolateral ankle and she has tenderness right in here as she points to the posterior tibial tendon of the right ankle. We are already of the knowledge that she has had a traumatic event resulted in severe osteoarthritic changes to the second metatarsophalangeal joint right. She states that the injection that we had placed in the joint last time she was ended absolutely no good whatsoever. She is ready for surgical reconstructive necessary.  Objective: Pulses are strongly palpable right foot. She has pain on palpation overlying the anterior talofibular ligament of the anterior lateral ankle. She also has pain with fluctuance in the posterior tibial tendon sheath. She has pain on inversion against resistance with weakening of the posterior tibial tendon. She still has pain on palpation of the second metatarsophalangeal joint of the right foot. There is mild edema about the right ankle and foot. No ecchymosis noted.  Assessment: Pain in limb secondary to history of trauma right ankle with right ankle lateral instability and posterior tibial tendon dysfunction as well as posterior tibial tendinitis right. 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  Plan: Secondary to the chronic pain associated with the ankle I think we would be best served by an MRI to reevaluate the laxity of the lateral ankle and evaluate for tear of the posterior tibial tendon. Once the MRI is confirmed we will schedule surgical intervention for the ankle as well as the second metatarsophalangeal joint.

## 2014-11-02 ENCOUNTER — Telehealth: Payer: Self-pay | Admitting: *Deleted

## 2014-11-02 NOTE — Telephone Encounter (Signed)
"  I need someone to get authorization for her MRI right foot without contrast.  [facility]."  When is it scheduled?  "It's scheduled for Monday.  Okay, I'll take care of it.

## 2014-11-03 NOTE — Telephone Encounter (Signed)
MRI was authorized, [id number].  [facility].  It was authorized from 11/03/2014-12/18/2014.

## 2014-11-06 ENCOUNTER — Telehealth: Payer: Self-pay | Admitting: *Deleted

## 2014-11-06 ENCOUNTER — Ambulatory Visit
Admission: RE | Admit: 2014-11-06 | Discharge: 2014-11-06 | Disposition: A | Discharge status: Home / Self Care | Payer: 59 | Source: Ambulatory Visit | Attending: Podiatry | Admitting: Podiatry

## 2014-11-06 DIAGNOSIS — M76821 Posterior tibial tendinitis, right leg: Secondary | ICD-10-CM

## 2014-11-06 DIAGNOSIS — M76829 Posterior tibial tendinitis, unspecified leg: Secondary | ICD-10-CM

## 2014-11-06 NOTE — Telephone Encounter (Signed)
"  I have an appointment to have a MRI today and it hasn't been approved."  It has been authorized.  "Oh, it has?  They were supposed to call and let me know.  Okay, thank you."

## 2014-11-13 ENCOUNTER — Telehealth: Payer: Self-pay | Admitting: *Deleted

## 2014-11-13 NOTE — Telephone Encounter (Signed)
-----   Message from Elinor ParkinsonMax T Hyatt, [place] sent at 11/06/2014  5:05 PM EST ----- Please inform patient that MRI is back and shows only arthritis but I want to send it out for an over read and the final dx will be delayed for approximately two weeks.  Thanks

## 2014-11-13 NOTE — Telephone Encounter (Signed)
I called and informed the patient that Dr. Al CorpusHyatt received the MRI back.  It showed Arthritis.  He wants to have it re-read by another Radiologist.  He just wanted to make you aware of the delay.  We will call you once we get the final results and let you know how to proceed.  "Okay, thank you."  I attempted to [facility] to request the MRI disk be sent to us.  They are closed due to inclement weather.

## 2014-11-15 NOTE — Telephone Encounter (Signed)
I called and requested MRI disk be sent to us.  "Okay, we'll get that out to you."

## 2014-11-21 NOTE — Telephone Encounter (Signed)
[facility] and [facility] in [facility] to not receiving MRI disk. "I had to put in another [facility] yesterday to come by and pick it up.  They came by this morning so you should get it today or tomorrow."

## 2014-11-27 ENCOUNTER — Telehealth: Payer: Self-pay | Admitting: Podiatry

## 2014-11-27 NOTE — Telephone Encounter (Signed)
Patient called saying its been about 3 weeks since she had her MRI and she has not heard anything. Please call the patient. Thank you.

## 2014-11-28 ENCOUNTER — Ambulatory Visit: Payer: 59 | Admitting: Podiatry

## 2014-11-28 NOTE — Telephone Encounter (Signed)
I called and informed patient that MRI results just came in today.  Dr. Al CorpusHyatt wants you to schedule an appointment to come in for results.  "How soon can I get in?"  I scheduled her for 8:15am on 11/30/2014.

## 2014-11-28 NOTE — Telephone Encounter (Signed)
I called [facility] to see if the results were ready for the MRI.  "It's done, you didn't get the results?"  No we have not received it.  "Okay, I'll fax it right over."

## 2014-11-30 ENCOUNTER — Telehealth: Payer: Self-pay | Admitting: *Deleted

## 2014-11-30 ENCOUNTER — Encounter: Payer: Self-pay | Admitting: Podiatry

## 2014-11-30 ENCOUNTER — Ambulatory Visit (INDEPENDENT_AMBULATORY_CARE_PROVIDER_SITE_OTHER): Payer: 59 | Admitting: Podiatry

## 2014-11-30 VITALS — BP 153/85 | HR 79

## 2014-11-30 DIAGNOSIS — M9271 Juvenile osteochondrosis of metatarsus, right foot: Secondary | ICD-10-CM

## 2014-11-30 DIAGNOSIS — M2041 Other hammer toe(s) (acquired), right foot: Secondary | ICD-10-CM

## 2014-11-30 NOTE — Telephone Encounter (Signed)
Patient wanted an estimate. I left her a message that her deductible has been met.  Insurance will cover at 70%, you'll be responsible for 30% which is about $567.74.  This is just the fee for the doctor.  If you have any questions feel free to give me a call back.

## 2014-11-30 NOTE — Patient Instructions (Signed)
Pre-Operative Instructions  Congratulations, you have decided to take an important step to improving your quality of life.  You can be assured that the doctors of [facility] will be with you every step of the way.  1. Plan to be at the surgery center/hospital at least 1 (one) hour prior to your scheduled time unless otherwise directed by the surgical center/hospital staff.  You must have a responsible adult accompany you, remain during the surgery and drive you home.  Make sure you have directions to the surgical center/hospital and know how to get there on time. 2. For hospital based surgery you will need to obtain a history and physical form from your family physician within 1 month prior to the date of surgery- we will give you a form for you primary physician.  3. We make every effort to accommodate the date you request for surgery.  There are however, times where surgery dates or times have to be moved.  We will contact you as soon as possible if a change in schedule is required.   4. No Aspirin/Ibuprofen for one week before surgery.  If you are on aspirin, any non-steroidal anti-inflammatory medications (Mobic, Aleve, Ibuprofen) you should stop taking it 7 days prior to your surgery.  You make take Tylenol  For pain prior to surgery.  5. Medications- If you are taking daily heart and blood pressure medications, seizure, reflux, allergy, asthma, anxiety, pain or diabetes medications, make sure the surgery center/hospital is aware before the day of surgery so they may notify you which medications to take or avoid the day of surgery. 6. No food or drink after midnight the night before surgery unless directed otherwise by surgical center/hospital staff. 7. No alcoholic beverages 24 hours prior to surgery.  No smoking 24 hours prior to or 24 hours after surgery. 8. Wear loose pants or shorts- loose enough to fit over bandages, boots, and casts. 9. No slip on shoes, sneakers are best. 10. Bring  your boot with you to the surgery center/hospital.  Also bring crutches or a walker if your physician has prescribed it for you.  If you do not have this equipment, it will be provided for you after surgery. 11. If you have not been contracted by the surgery center/hospital by the day before your surgery, call to confirm the date and time of your surgery. 12. Leave-time from work may vary depending on the type of surgery you have.  Appropriate arrangements should be made prior to surgery with your employer. 13. Prescriptions will be provided immediately following surgery by your doctor.  Have these filled as soon as possible after surgery and take the medication as directed. 14. Remove nail polish on the operative foot. 15. Wash the night before surgery.  The night before surgery wash the foot and leg well with the antibacterial soap provided and water paying special attention to beneath the toenails and in between the toes.  Rinse thoroughly with water and dry well with a towel.  Perform this wash unless told not to do so by your physician.  Enclosed: 1 Ice pack (please put in freezer the night before surgery)   1 Hibiclens skin cleaner   Pre-op Instructions  If you have any questions regarding the instructions, do not hesitate to call our office.  [place]: [address] [place], [place] [address] [phone]  [place]: [address] [place], [place] [address] [phone]  [place]: [address]  [place], [place] [address] [phone]  Dr. Richard   Tuchman DPM, Dr. Norman Regal DPM Dr. Richard Sikora DPM, Dr. M. Todd Kamaal Cast DPM, Dr. Kathryn Egerton DPM 

## 2014-11-30 NOTE — Progress Notes (Signed)
She presents today for a follow-up of her pain to the second metatarsophalangeal joint of the right foot. Her MRI results have returned. She states that she would like to have surgery on this as soon as possible to correct this problem.  Objective: Vital signs are stable she is alert and oriented 3. She still has pain on palpation second metatarsophalangeal joint of the right foot. 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  Plan: Discussed etiology pathology conservative versus surgical therapies at this point in time we will perform an arthroplasty with a soft tissue interposition to alleviate her symptoms. We also performed a hammertoe repair with PIPJ arthrodesis and pinning across the joint. We will over the consent form today line by line number by number given her ample time to ask questions she saw fit regarding this procedure I answered them to the best of my ability in layman's terms. She understood this was amenable to it and signed on 3 pages of the consent form. We discussed possible postop complications which may include but are not limited to postop pain bleeding swelling infection recurrence need for further surgery also loss of digit loss of limb and/or loss of life. I will follow up with her in the near future for surgery.

## 2014-12-19 ENCOUNTER — Encounter: Payer: Self-pay | Admitting: Podiatry

## 2015-01-04 ENCOUNTER — Other Ambulatory Visit: Payer: Self-pay | Admitting: Podiatry

## 2015-01-04 MED ORDER — PROMETHAZINE HCL 25 MG PO TABS: 25.0000 mg | ORAL_TABLET | Freq: Three times a day (TID) | ORAL | Status: DC | PRN

## 2015-01-04 MED ORDER — CLINDAMYCIN HCL 150 MG PO CAPS: 150.0000 mg | ORAL_CAPSULE | Freq: Three times a day (TID) | ORAL | Status: DC

## 2015-01-04 MED ORDER — OXYCODONE-ACETAMINOPHEN 10-325 MG PO TABS: ORAL_TABLET | ORAL | Status: DC

## 2015-01-05 ENCOUNTER — Telehealth: Payer: Self-pay | Admitting: *Deleted

## 2015-01-05 DIAGNOSIS — M779 Enthesopathy, unspecified: Secondary | ICD-10-CM | POA: Diagnosis not present

## 2015-01-05 DIAGNOSIS — M2041 Other hammer toe(s) (acquired), right foot: Secondary | ICD-10-CM | POA: Diagnosis not present

## 2015-01-05 NOTE — Telephone Encounter (Signed)
Pt states she does not understand her post-op instructions.  Pt didn't know how long to be up on the foot, if she needed crutches, or if she needed to ice.  I told pt to only be up on the surgical foot 5 minutes/hour, could use crutches for balance, ice 10 - 15 minutes/hour if possible.  Pt agreed.

## 2015-01-11 ENCOUNTER — Ambulatory Visit (INDEPENDENT_AMBULATORY_CARE_PROVIDER_SITE_OTHER): Payer: 59 | Admitting: Podiatry

## 2015-01-11 ENCOUNTER — Ambulatory Visit (INDEPENDENT_AMBULATORY_CARE_PROVIDER_SITE_OTHER): Payer: 59

## 2015-01-11 DIAGNOSIS — M2041 Other hammer toe(s) (acquired), right foot: Secondary | ICD-10-CM

## 2015-01-11 DIAGNOSIS — Z9889 Other specified postprocedural states: Secondary | ICD-10-CM

## 2015-01-11 NOTE — Progress Notes (Signed)
She presents today for follow-up of an arthroplasty second metatarsophalangeal joint and a arthrodesis of the PIPJ second digit right foot with K wire. She denies nausea vomiting muscle aches and pains. She states that she's run a low-grade fever of approximately 99.79F she's been staying off the foot as much as possible keeping it elevated and using crutches. She denies chest pain and shortness of breath.  Objective: Vital signs are stable she is alert and oriented 3. Dry sterile dressing was intact today as she presents with a short Cam Walker. Once the dressing was removed demonstrates minimal edema and no erythema cellulitis drainage or odor K wires in good position and and intact radiographs do not demonstrate any type of osseus abnormalities in the area no fractures and good alignment of the toe.  Assessment: Well-healing surgical foot status post 6 days arthroplasty second metatarsophalangeal joint with pin.  Plan: Redress today dry sterile compressive dressing. Continue conservative therapies with elevation and limited weightbearing. Follow up with me in 1 week.

## 2015-01-15 ENCOUNTER — Telehealth: Payer: Self-pay | Admitting: Podiatry

## 2015-01-15 NOTE — Telephone Encounter (Signed)
Per ot toe that she had surgery on 4/8 with Dr Al CorpusHyatt has been burning the last 2 or 3 days especially in the morning. Is this normal. Pt scheduled to see Dr Al CorpusHyatt this Thursday.

## 2015-01-16 NOTE — Telephone Encounter (Signed)
Pt states she has had some burning the past couple of days in her surgery toe.  I told pt that as healing continues there may be some microscopic nerve healing and firing, or if she was up on the foot too much it may cause that sensation, but it start to dissipate as post-op healing continued.  Pt asked if there was Latex in the blue tape on her toe, I told her there was, but Latex allergy was not in her Allergy List.  Pt states she forgot to tell us.

## 2015-01-18 ENCOUNTER — Encounter: Payer: Self-pay | Admitting: Podiatry

## 2015-01-18 ENCOUNTER — Ambulatory Visit (INDEPENDENT_AMBULATORY_CARE_PROVIDER_SITE_OTHER): Payer: 59 | Admitting: Podiatry

## 2015-01-18 DIAGNOSIS — Z9889 Other specified postprocedural states: Secondary | ICD-10-CM

## 2015-01-18 DIAGNOSIS — M2041 Other hammer toe(s) (acquired), right foot: Secondary | ICD-10-CM

## 2015-01-23 NOTE — Progress Notes (Signed)
She presents today status post arthroplasty and hammertoe repair second digit left foot. K wires intact. She denies fevers chills nausea vomiting muscle aches and pains.  Objective: Vital signs are stable she is alert and oriented 3. Sutures are intact once evaluated today they were removed. I see no signs of infection. K wire remains in place without signs of infection.  Assessment: Well-healing surgical foot. Sutures were removed.  Plan: Redressed today encourage her to keep his foot dry and clean will follow up with her in 2 weeks. She is placed in a Darco shoe.

## 2015-01-30 ENCOUNTER — Encounter: Payer: 59 | Admitting: Podiatry

## 2015-02-01 ENCOUNTER — Ambulatory Visit (INDEPENDENT_AMBULATORY_CARE_PROVIDER_SITE_OTHER): Payer: 59

## 2015-02-01 ENCOUNTER — Ambulatory Visit (INDEPENDENT_AMBULATORY_CARE_PROVIDER_SITE_OTHER): Payer: 59 | Admitting: Podiatry

## 2015-02-01 ENCOUNTER — Encounter: Payer: Self-pay | Admitting: Podiatry

## 2015-02-01 DIAGNOSIS — M2011 Hallux valgus (acquired), right foot: Secondary | ICD-10-CM

## 2015-02-01 DIAGNOSIS — Z9889 Other specified postprocedural states: Secondary | ICD-10-CM

## 2015-02-01 NOTE — Progress Notes (Signed)
She presents today 4 weeks status post arthroplasty second metatarsophalangeal joint with K wire right foot. She denies fever chills nausea vomiting muscle aches and pains.  Objective: Vital signs are stable alert and oriented 3. Second toe appears to be healing very well. No erythema edema cellulitis drainage or odor. Radiographs confirm the placement of the K wire at this time.  Assessment: Well-healing surgical toe right.  Plan: I will allow her to start washing this foot and soaking in Epsom salts and warm water. I will follow-up with her in 2 weeks for pin removal. I placed her in a Darco shoe today and I will allow her to wear her Cam Walker at bedtime. She is not to walk on this without either one of these devices.

## 2015-02-07 ENCOUNTER — Emergency Department
Admission: EM | Admit: 2015-02-07 | Discharge: 2015-02-07 | Disposition: A | Discharge status: Home / Self Care | Payer: 59 | Source: Home / Self Care | Attending: Family Medicine | Admitting: Family Medicine

## 2015-02-07 ENCOUNTER — Encounter: Payer: Self-pay | Admitting: Emergency Medicine

## 2015-02-07 DIAGNOSIS — J069 Acute upper respiratory infection, unspecified: Secondary | ICD-10-CM | POA: Diagnosis not present

## 2015-02-07 DIAGNOSIS — R059 Cough, unspecified: Secondary | ICD-10-CM

## 2015-02-07 DIAGNOSIS — B9789 Other viral agents as the cause of diseases classified elsewhere: Principal | ICD-10-CM

## 2015-02-07 DIAGNOSIS — R05 Cough: Secondary | ICD-10-CM | POA: Diagnosis not present

## 2015-02-07 LAB — POCT CBC W AUTO DIFF (K'VILLE URGENT CARE)

## 2015-02-07 MED ORDER — TRIAMCINOLONE ACETONIDE 40 MG/ML IJ SUSP
40.0000 mg | Freq: Once | INTRAMUSCULAR | Status: AC
  Administered 2015-02-07: 40 mg via INTRAMUSCULAR

## 2015-02-07 MED ORDER — BENZONATATE 200 MG PO CAPS: 200.0000 mg | ORAL_CAPSULE | Freq: Every day | ORAL | Status: DC

## 2015-02-07 MED ORDER — DOXYCYCLINE HYCLATE 100 MG PO CAPS: 100.0000 mg | ORAL_CAPSULE | Freq: Two times a day (BID) | ORAL | Status: DC

## 2015-02-07 NOTE — ED Provider Notes (Signed)
CSN: [id number]     Arrival date & time 02/07/15  [id number] History   First MD Initiated Contact with Patient 02/07/15 1002     Chief Complaint  Patient presents with  . Cough      HPI Comments: About 10 days ago patient developed typical cold-like symptoms including mild sore throat, sinus congestion, headache, fatigue, and cough. She had nausea/vomiting briefly.  She complains of persistent non productive cough, worse at night.  She has occasional wheezing and shortness of breath with activity.  Her initial fever/chills resolved, but yesterday she had recurrent chills. She continues to smoke.  She has a Dulera inhaler at home that she occasionally uses.  She has had pneumonia in the past.  She has seasonal rhinitis and takes Careers adviserAllegra.  The history is provided by the patient.    Past Medical History  Diagnosis Date  . Allergy   . Endometriosis    Past Surgical History  Procedure Laterality Date  . Appendectomy    . Knee arthroscopy Left   . Laparoscopy      endometriosis   Family History  Problem Relation Age of Onset  . Hypertension Father    History  Substance Use Topics  . Smoking status: Current Every Day Smoker -- 45 years    Types: Cigarettes  . Smokeless tobacco: Never Used  . Alcohol Use: 1.8 - 2.4 oz/week    3-4 Glasses of wine per week     Comment: Red Wine   OB History    No data available     Review of Systems + sore throat + cough No pleuritic pain + wheezing + nasal congestion + post-nasal drainage No sinus pain/pressure No itchy/red eyes No earache No hemoptysis + SOB with activity + fever, + chills + nausea, resolved + vomiting, resolved No abdominal pain No diarrhea No urinary symptoms No skin rash + fatigue + myalgias + headache Used OTC meds without relief  Allergies  Ciprocin-fluocin-procin; Latex; Penicillins; and Sulfonamide derivatives  Home Medications   Prior to Admission medications   Medication Sig Start Date End Date  Taking? Authorizing Provider  ALPRAZolam Prudy Feeler(XANAX) 0.5 MG tablet  07/31/13   Historical Provider, MD  benzonatate (TESSALON) 200 MG capsule Take 1 capsule (200 mg total) by mouth at bedtime. Take as needed for cough 02/07/15   Lattie HawStephen A Beese, MD  doxycycline (VIBRAMYCIN) 100 MG capsule Take 1 capsule (100 mg total) by mouth 2 (two) times daily. Take with food (Rx void after 02/15/15) 02/07/15   Lattie HawStephen A Beese, MD  Multiple Vitamins-Minerals (MULTIVITAMIN WITH MINERALS) tablet Take 1 tablet by mouth daily.    Historical Provider, MD  oxyCODONE-acetaminophen (PERCOCET) 10-325 MG per tablet Take one to two tablets by mouth every six to eight hours as needed for pain. 01/04/15   Max T Hyatt, DPM  sertraline (ZOLOFT) 20 MG/ML concentrated solution Take 25 mg by mouth daily.    Historical Provider, MD  ursodiol (ACTIGALL) 250 MG tablet Take 250 mg by mouth 3 (three) times daily.    Historical Provider, MD   BP 122/70 mmHg  Pulse 75  Temp(Src) 98 F (36.7 C) (Oral)  Ht 5\' 3"  (1.6 m)  Wt 140 lb (63.504 kg)  BMI 24.81 kg/m2  SpO2 97% Physical Exam Nursing notes and Vital Signs reviewed. Appearance:  Patient appears stated age, and in no acute distress Eyes:  Pupils are equal, round, and reactive to light and accomodation.  Extraocular movement is intact.  Conjunctivae are not inflamed  Ears:  Canals normal.  Tympanic membranes normal.  Nose:  Mildly congested turbinates.  No sinus tenderness.   Pharynx:  Normal Neck:  Supple.  Tender enlarged posterior nodes are palpated bilaterally  Lungs:  Generally clear; "tubular" breath sounds.  Breath sounds are equal.  Chest:  Distinct tenderness to palpation over the mid-sternum.  Heart:  Regular rate and rhythm without murmurs, rubs, or gallops.  Abdomen:  Nontender without masses or hepatosplenomegaly.  Bowel sounds are present.  No CVA or flank tenderness.  Extremities:  Trace edema right lower leg (above cam walker).  No calf tenderness Skin:  No rash  present.   ED Course  Procedures  None     Labs Reviewed  POCT CBC W AUTO DIFF ([facility]):  WBC 6.9; LY 24.0; MO 8.7; GR 67.3; Hgb 12.8; Platelets 310          MDM   1. Viral URI with cough   2. Cough    Kenalog 40mg  IM.  Prescription written for Benzonatate [facility](Tessalon) to take at bedtime for night-time cough.  Take plain guaifenesin (1200mg  extended release tabs such as Mucinex) twice daily, with plenty of water, for cough and congestion.  May add Pseudoephedrine (30mg , one or two every 4 to 6 hours) for sinus congestion.  Get adequate rest.   May use Afrin nasal spray (or generic oxymetazoline) twice daily for about 5 days.  Also recommend using saline nasal spray several times daily and saline nasal irrigation (AYR is a common brand).   Try warm salt water gargles for sore throat.  Stop all antihistamines for now, and other non-prescription cough/cold preparations. May continue [facility]Dulera. Begin doxycycline if not improving about 3 to 4 days or if persistent fever develops (Given a prescription to hold, with an expiration date)  Follow-up with family doctor if not improving about 7 to10 days.    Lattie HawStephen A Beese, MD 02/07/15 1044

## 2015-02-07 NOTE — Discharge Instructions (Signed)
Take plain guaifenesin (1200mg  extended release tabs such as Mucinex) twice daily, with plenty of water, for cough and congestion.  May add Pseudoephedrine (30mg , one or two every 4 to 6 hours) for sinus congestion.  Get adequate rest.   May use Afrin nasal spray (or generic oxymetazoline) twice daily for about 5 days.  Also recommend using saline nasal spray several times daily and saline nasal irrigation (AYR is a common brand).   Try warm salt water gargles for sore throat.  Stop all antihistamines for now, and other non-prescription cough/cold preparations. [facility]. Begin doxycycline if not improving about 3 to 4 days or if persistent fever develops  Follow-up with family doctor if not improving about 7 to10 days.

## 2015-02-07 NOTE — ED Notes (Signed)
Cough, sore throat, congestion, fever, headache x 1 week

## 2015-02-15 ENCOUNTER — Ambulatory Visit (INDEPENDENT_AMBULATORY_CARE_PROVIDER_SITE_OTHER): Payer: 59

## 2015-02-15 ENCOUNTER — Encounter: Payer: Self-pay | Admitting: Podiatry

## 2015-02-15 ENCOUNTER — Ambulatory Visit (INDEPENDENT_AMBULATORY_CARE_PROVIDER_SITE_OTHER): Payer: 59 | Admitting: Podiatry

## 2015-02-15 VITALS — BP 149/76 | HR 78 | Resp 12

## 2015-02-15 DIAGNOSIS — Z9889 Other specified postprocedural states: Secondary | ICD-10-CM

## 2015-02-15 DIAGNOSIS — M2041 Other hammer toe(s) (acquired), right foot: Secondary | ICD-10-CM

## 2015-02-17 NOTE — Progress Notes (Signed)
She presents today 6 weeks status post arthroplasty metatarsophalangeal joint and hammertoe repair second toe right. She states that the bottom of her second toe is just sore from walking on the end of it.  Objective: Vital signs are stable alert and oriented 3. Mild erythema to the distal aspect of the toe see no signs of infection however. I removed the K wire today with some tenderness as it exited the toe. There is no purulence and no malodor.  Assessment: Well-healing surgical foot.  Plan: I encouraged her to wear the Darco shoe for another couple of weeks and I encouraged her to dress the toe with compression dressing wrap. I will follow up with her in 2-3 weeks for reevaluation. Currently this looks very good

## 2015-03-01 ENCOUNTER — Ambulatory Visit (INDEPENDENT_AMBULATORY_CARE_PROVIDER_SITE_OTHER): Payer: 59 | Admitting: Podiatry

## 2015-03-01 ENCOUNTER — Encounter: Payer: Self-pay | Admitting: Podiatry

## 2015-03-01 ENCOUNTER — Ambulatory Visit (INDEPENDENT_AMBULATORY_CARE_PROVIDER_SITE_OTHER): Payer: 59

## 2015-03-01 VITALS — BP 128/73 | HR 77 | Resp 16

## 2015-03-01 DIAGNOSIS — Z9889 Other specified postprocedural states: Secondary | ICD-10-CM

## 2015-03-01 DIAGNOSIS — M2041 Other hammer toe(s) (acquired), right foot: Secondary | ICD-10-CM

## 2015-03-03 NOTE — Progress Notes (Signed)
She presents today for surgical follow-up regarding arthroplasty second metatarsophalangeal joint left foot 01/05/2015. She states that the toe is still sore.  Objective: Vital signs are stable she is alert and oriented 3. The toe is rectus and with minimal edema she has good range of motion of the second metatarsophalangeal joint though it is not a full range of motion it is better than what she had and is less painful.  Assessment: Well healing arthroplasty second metatarsophalangeal joint left with a rectus toe and PIPJ fusion.  Plan: Discussed etiology pathology conservative versus surgical therapies. I encouraged her to get back into a stiff soled shoe and I will follow-up with her 1 month.

## 2015-03-29 ENCOUNTER — Encounter: Payer: 59 | Admitting: Podiatry

## 2015-04-05 ENCOUNTER — Ambulatory Visit (INDEPENDENT_AMBULATORY_CARE_PROVIDER_SITE_OTHER): Payer: 59

## 2015-04-05 ENCOUNTER — Other Ambulatory Visit: Payer: Self-pay | Admitting: Podiatry

## 2015-04-05 ENCOUNTER — Ambulatory Visit (INDEPENDENT_AMBULATORY_CARE_PROVIDER_SITE_OTHER): Payer: 59 | Admitting: Podiatry

## 2015-04-05 VITALS — BP 121/74 | HR 72 | Resp 16

## 2015-04-05 DIAGNOSIS — M2042 Other hammer toe(s) (acquired), left foot: Secondary | ICD-10-CM

## 2015-04-05 DIAGNOSIS — Z9889 Other specified postprocedural states: Secondary | ICD-10-CM

## 2015-04-05 NOTE — Progress Notes (Signed)
She presents today nearly 3 months status post arthroplasty with interposition of tendon second metatarsophalangeal joint right foot. Arthrodesis second PIPJ right foot. She states this seems to be doing okay however concerned about swelling of the toe and the tenderness to the tip of the toe.  Objective: Vital signs are stable alert and oriented 3. Minimal edema about the foot. Pulses remain well palpable and capillary fill time to all the digits. Foot is warm to the touch. She's great range of motion without pain second metatarsophalangeal joint. Mild tenderness on palpation of the distal tuft of the second toe right is small area of nodularity this is consistent with scar tissue from the K wire that was present. Radiograph confirms very nice arthroplasty with good digital arthrodesis. Toe is perfectly rectus.  Assessment: Well-healing surgical foot right 3 months postop. She still has some tenderness about the surgical toe but this should go on to resolve in the near future.  Plan: I encouraged her to get back to her regular routine. I will follow-up with her in 2 months if necessary

## 2015-04-24 ENCOUNTER — Encounter: Payer: Self-pay | Admitting: Podiatry

## 2015-04-24 ENCOUNTER — Ambulatory Visit (INDEPENDENT_AMBULATORY_CARE_PROVIDER_SITE_OTHER): Payer: 59

## 2015-04-24 ENCOUNTER — Encounter: Payer: Self-pay | Admitting: *Deleted

## 2015-04-24 ENCOUNTER — Ambulatory Visit (INDEPENDENT_AMBULATORY_CARE_PROVIDER_SITE_OTHER): Payer: 59 | Admitting: Podiatry

## 2015-04-24 VITALS — BP 135/80 | HR 74 | Resp 15

## 2015-04-24 DIAGNOSIS — Z9889 Other specified postprocedural states: Secondary | ICD-10-CM

## 2015-04-24 DIAGNOSIS — M2041 Other hammer toe(s) (acquired), right foot: Secondary | ICD-10-CM

## 2015-04-24 NOTE — Progress Notes (Signed)
She presents today concerned of a small bump to the dorsal aspect of the second digit of the right foot. She is also concerned that the third toe is still swelling and hasn't gone down very much. She states that is not very sore but was concerned about swelling.  Objective: Vital signs are stable she is alert and oriented 3 mild edema about the third digit of the right foot small palpable nodule dorsal aspect PIPJ second digit right foot. Radiographs do not restrict any type of osseous abnormalities.  Assessment: Contusion third digit right foot. Normal osseous architecture second digit right foot status post PIPJ fusion.  Plan: Discussed the findings with the patient and I will follow-up with her in September for her regular scheduled appointment.

## 2015-05-21 ENCOUNTER — Other Ambulatory Visit: Payer: Self-pay | Admitting: Gastroenterology

## 2015-05-21 DIAGNOSIS — R1012 Left upper quadrant pain: Secondary | ICD-10-CM

## 2015-05-24 ENCOUNTER — Ambulatory Visit
Admission: RE | Admit: 2015-05-24 | Discharge: 2015-05-24 | Disposition: A | Discharge status: Home / Self Care | Payer: BLUE CROSS/BLUE SHIELD | Source: Ambulatory Visit | Attending: Gastroenterology | Admitting: Gastroenterology

## 2015-05-24 DIAGNOSIS — R1012 Left upper quadrant pain: Secondary | ICD-10-CM

## 2015-06-07 ENCOUNTER — Ambulatory Visit: Payer: 59 | Admitting: Podiatry

## 2015-06-08 ENCOUNTER — Other Ambulatory Visit: Payer: Self-pay | Admitting: Gastroenterology

## 2016-07-15 DIAGNOSIS — R202 Paresthesia of skin: Secondary | ICD-10-CM | POA: Insufficient documentation

## 2016-07-15 DIAGNOSIS — F17200 Nicotine dependence, unspecified, uncomplicated: Secondary | ICD-10-CM | POA: Insufficient documentation

## 2016-07-15 DIAGNOSIS — K743 Primary biliary cirrhosis: Secondary | ICD-10-CM | POA: Insufficient documentation

## 2016-07-25 ENCOUNTER — Other Ambulatory Visit: Payer: Self-pay | Admitting: Obstetrics & Gynecology

## 2016-07-25 ENCOUNTER — Other Ambulatory Visit: Payer: Self-pay | Admitting: Obstetrics and Gynecology

## 2016-07-25 DIAGNOSIS — Z1231 Encounter for screening mammogram for malignant neoplasm of breast: Secondary | ICD-10-CM

## 2016-08-25 ENCOUNTER — Ambulatory Visit
Admission: RE | Admit: 2016-08-25 | Discharge: 2016-08-25 | Disposition: A | Discharge status: Home / Self Care | Payer: Managed Care, Other (non HMO) | Source: Ambulatory Visit | Attending: Obstetrics and Gynecology | Admitting: Obstetrics and Gynecology

## 2016-08-25 DIAGNOSIS — Z1231 Encounter for screening mammogram for malignant neoplasm of breast: Secondary | ICD-10-CM

## 2017-04-28 DIAGNOSIS — R0789 Other chest pain: Secondary | ICD-10-CM | POA: Insufficient documentation

## 2017-04-28 DIAGNOSIS — Z0000 Encounter for general adult medical examination without abnormal findings: Secondary | ICD-10-CM | POA: Insufficient documentation

## 2017-04-28 DIAGNOSIS — F339 Major depressive disorder, recurrent, unspecified: Secondary | ICD-10-CM | POA: Insufficient documentation

## 2017-05-27 DIAGNOSIS — R059 Cough, unspecified: Secondary | ICD-10-CM | POA: Insufficient documentation

## 2017-05-27 DIAGNOSIS — R05 Cough: Secondary | ICD-10-CM | POA: Insufficient documentation

## 2017-07-28 DIAGNOSIS — M79642 Pain in left hand: Secondary | ICD-10-CM

## 2017-07-28 DIAGNOSIS — M79641 Pain in right hand: Secondary | ICD-10-CM | POA: Insufficient documentation

## 2017-07-28 DIAGNOSIS — R6889 Other general symptoms and signs: Secondary | ICD-10-CM | POA: Insufficient documentation

## 2018-02-25 ENCOUNTER — Telehealth: Payer: Self-pay | Admitting: *Deleted

## 2018-02-25 NOTE — Telephone Encounter (Signed)
Pt states she had surgery on her foot several years back and was sleeping in a chair with the foot beneath her leg, and got up and put the foot down, and doesn't know how she put it down but now has pain and swelling. Pt has an appt 03/09/2018, and would like to know what to do until seen.

## 2018-02-25 NOTE — Telephone Encounter (Signed)
I told pt to go back into her black surgery shoe or boot with the stiff bottom sole this would decrease the movement of the injury site and decrease irritation, to ice 3-4 times a day for 15 minutes/session protecting the skin from the ice with a cloth. I told pt I would have the schedulers put her on the cancellation schedule.

## 2018-03-04 ENCOUNTER — Telehealth: Payer: Self-pay | Admitting: *Deleted

## 2018-03-04 NOTE — Telephone Encounter (Signed)
Pt states she is the one that stepped wrong after a nap and hurt her toe, she now has ankle pain and swelling. I asked scheduler to call pt to schedule for this week or Saturday.

## 2018-03-06 ENCOUNTER — Ambulatory Visit (INDEPENDENT_AMBULATORY_CARE_PROVIDER_SITE_OTHER): Payer: 59

## 2018-03-06 ENCOUNTER — Other Ambulatory Visit: Payer: Self-pay | Admitting: Podiatry

## 2018-03-06 ENCOUNTER — Encounter

## 2018-03-06 ENCOUNTER — Ambulatory Visit: Payer: 59 | Admitting: Podiatry

## 2018-03-06 ENCOUNTER — Encounter: Payer: Self-pay | Admitting: Podiatry

## 2018-03-06 DIAGNOSIS — S92404A Nondisplaced unspecified fracture of right great toe, initial encounter for closed fracture: Secondary | ICD-10-CM

## 2018-03-06 DIAGNOSIS — S9031XA Contusion of right foot, initial encounter: Secondary | ICD-10-CM

## 2018-03-06 NOTE — Progress Notes (Signed)
She presents today after having not seen her for quite some time with a chief complaint of pain to the first metatarsophalangeal joint and the hallux right.  She states that she fell approximately 2 weeks ago.  She states that she was trying to stand while her foot was asleep and ended up rolling her foot under her leg.  She states that she is tried icing and wearing a surgical type shoe.  Objective: Vital signs are stable she is alert and oriented x3.  Pulses are palpable.  Neurologic sensorium is intact.  Deep tendon reflexes are intact.  Muscle strength is normal symmetrical.  Great range of motion of the first metatarsophalangeal joint end of the hallux interphalangeal joint there she does have tenderness on the tip of the toe where there is some ecchymosis and some mild erythema.  She has some mild edema to the leg but nonpitting in nature.  Radiographs taken today demonstrates a nondisplaced fracture of the tuft of the distal phalanx hallux right.  Some soft tissue swelling around the first metatarsophalangeal joint other than that no acute findings.  Assessment: Pain in limb secondary to fracture distal phalanx hallux right nondisplaced non-comminuted.  Plan: Provided her with wrap today demonstrating how to wrap the toe in a regular manner she will follow-up with me in a month if necessary otherwise she will continue to wear the Darco shoe for at least another 2 weeks.  I explained to her that it could actually take several months for the toe to feel 100% better.

## 2018-03-09 ENCOUNTER — Ambulatory Visit: Payer: 59 | Admitting: Podiatry

## 2018-08-23 ENCOUNTER — Other Ambulatory Visit: Payer: Self-pay | Admitting: Obstetrics & Gynecology

## 2018-08-23 DIAGNOSIS — Z1231 Encounter for screening mammogram for malignant neoplasm of breast: Secondary | ICD-10-CM

## 2018-09-14 ENCOUNTER — Ambulatory Visit
Admission: RE | Admit: 2018-09-14 | Discharge: 2018-09-14 | Disposition: A | Discharge status: Home / Self Care | Payer: 59 | Source: Ambulatory Visit | Attending: Obstetrics & Gynecology | Admitting: Obstetrics & Gynecology

## 2018-09-14 DIAGNOSIS — Z1231 Encounter for screening mammogram for malignant neoplasm of breast: Secondary | ICD-10-CM

## 2018-10-04 DIAGNOSIS — J111 Influenza due to unidentified influenza virus with other respiratory manifestations: Secondary | ICD-10-CM | POA: Insufficient documentation

## 2019-03-31 DIAGNOSIS — F5101 Primary insomnia: Secondary | ICD-10-CM | POA: Insufficient documentation

## 2019-05-31 DIAGNOSIS — L659 Nonscarring hair loss, unspecified: Secondary | ICD-10-CM | POA: Insufficient documentation

## 2019-08-01 ENCOUNTER — Other Ambulatory Visit: Payer: Self-pay | Admitting: Gastroenterology

## 2019-08-01 DIAGNOSIS — K743 Primary biliary cirrhosis: Secondary | ICD-10-CM

## 2019-11-29 DIAGNOSIS — M810 Age-related osteoporosis without current pathological fracture: Secondary | ICD-10-CM | POA: Insufficient documentation

## 2019-12-09 ENCOUNTER — Ambulatory Visit
Admission: RE | Admit: 2019-12-09 | Discharge: 2019-12-09 | Disposition: A | Discharge status: Home / Self Care | Payer: 59 | Source: Ambulatory Visit | Attending: Gastroenterology | Admitting: Gastroenterology

## 2019-12-09 DIAGNOSIS — K743 Primary biliary cirrhosis: Secondary | ICD-10-CM

## 2019-12-15 ENCOUNTER — Ambulatory Visit: Payer: 59 | Admitting: Podiatry

## 2019-12-15 ENCOUNTER — Telehealth: Payer: Self-pay | Admitting: *Deleted

## 2019-12-15 ENCOUNTER — Encounter: Payer: Self-pay | Admitting: Podiatry

## 2019-12-15 ENCOUNTER — Other Ambulatory Visit: Payer: Self-pay

## 2019-12-15 ENCOUNTER — Ambulatory Visit (INDEPENDENT_AMBULATORY_CARE_PROVIDER_SITE_OTHER): Payer: 59

## 2019-12-15 DIAGNOSIS — S92001A Unspecified fracture of right calcaneus, initial encounter for closed fracture: Secondary | ICD-10-CM | POA: Diagnosis not present

## 2019-12-15 DIAGNOSIS — S9001XA Contusion of right ankle, initial encounter: Secondary | ICD-10-CM

## 2019-12-15 DIAGNOSIS — S92901A Unspecified fracture of right foot, initial encounter for closed fracture: Secondary | ICD-10-CM

## 2019-12-15 MED ORDER — HYDROCODONE-ACETAMINOPHEN 10-325 MG PO TABS: 1.0000 | ORAL_TABLET | Freq: Three times a day (TID) | ORAL | 0 refills | Status: AC | PRN

## 2019-12-15 NOTE — Telephone Encounter (Signed)
Dr. Al Corpus asked if you could kindly do this pre-cert since its STAT and I am unable to do it during clinic. She wants this CT to be done either today or tomorrow.

## 2019-12-15 NOTE — Telephone Encounter (Signed)
Chart note completed

## 2019-12-15 NOTE — Progress Notes (Signed)
She presents today urgently to our office as a 64 year old white female with a chief complaint of pain to her right heel and ankle.  She states that is swollen and bruised more medially than laterally as she points to the area as she says the posterior heel is also hurting.  She states that she has been trying to be nonweightbearing because it hurts to put her foot down and she has been wearing her boot.  She denies fever chills nausea vomiting muscle aches and pain states that she fell off of a two-step wooden stepstool.  She states that she hit her heel directly on the floor very hard with all of her body weight behind it.  ROS: Denies fever chills nausea vomiting muscle aches pains other than the foot.  Denies change in her past medical history medications allergies surgeries and social history.  Objective: Vital signs are stable alert and oriented x3 right foot is swollen edematous and ecchymotic particularly around the heel is exquisitely painful on palpation the distal tibia and fibula do not appear to be involved there is no pain on palpation of this area she has dorsiflexion and plantarflexion though with forced plantar flexion her heel is very painful.  Radiographs taken today demonstrate a fracture to the plantar aspect of the heel at the plantar tubercle at the insertion site of the Achilles posterior plantar as well as the plantar fascial site.  She also has a fracture in the floor of the middle facet of the subtalar joint.  This appears to be more of a through and through fracture with a diastases I am not able to see the walls of the calcaneus very well.  I do not see a fracture of the navicular bone and I do not see any fracture of the talus.  Assessment comminuted fracture of the calcaneus right.  Plan: Discussed etiology pathology conservative versus surgical therapies at this point we placed her in a compression device and placed her back in her cam walker.  I instructed her to be  completely nonweightbearing utilizing a knee scooter or crutches or wheelchair.  She is to keep this elevated above the level of her heart we did write a prescription for Vicodin 21 tablets.  We did discuss the alternative to this with higher dose ibuprofen and Tylenol.  She understands this and is amenable to it.  We will request a stat CT for evaluation of the foot for surgical repair.  CT is for surgical planning primarily.

## 2019-12-15 NOTE — Telephone Encounter (Signed)
-----   Message from Kristian Covey, [facility] sent at 12/15/2019 11:32 AM EDT ----- Regarding: CT-STAT CT (STAT) right foot - evaluate calcaneal fracture right, history of fall 12/14/19

## 2019-12-15 NOTE — Telephone Encounter (Signed)
CT Stat orders faxed to [facility], given to A. Prevette, CMA for pre-cert.

## 2019-12-15 NOTE — Telephone Encounter (Signed)
Rosemarie Beath, CMA received [employer] Authorization, Reference: [id number] no prior authorization required in-network. Faxed to [facility]. Rosemarie Beath, CMA called [facility] and they were on the phone with pt to be performed stat.

## 2019-12-16 ENCOUNTER — Other Ambulatory Visit: Payer: Self-pay | Admitting: Podiatry

## 2019-12-16 ENCOUNTER — Ambulatory Visit
Admission: RE | Admit: 2019-12-16 | Discharge: 2019-12-16 | Disposition: A | Discharge status: Home / Self Care | Payer: 59 | Source: Ambulatory Visit | Attending: Podiatry | Admitting: Podiatry

## 2019-12-16 DIAGNOSIS — S92901A Unspecified fracture of right foot, initial encounter for closed fracture: Secondary | ICD-10-CM

## 2019-12-16 NOTE — Telephone Encounter (Signed)
[facility] - Vashti Hey states pt is approved and is coming in as a walk-in today.

## 2019-12-19 ENCOUNTER — Telehealth: Payer: Self-pay | Admitting: Podiatry

## 2019-12-19 NOTE — Telephone Encounter (Signed)
I told pt I had reviewed the last office note and she would need an appt regardless of the CT results and I would have a scheduler contact her once they had returned from lunch. D. Miner - Orthotic and scheduling coordinator scheduled with Dr. Al Corpus tomorrow at 11:30am to arrive at 11:15am, per his assistant A. Prevette, CMA.

## 2019-12-19 NOTE — Telephone Encounter (Signed)
Pt called asking if she could get her ct results.She stated they are in her mychart and wants to know how long she will have to wait. I explained that he was not in the office today and he would be the one that would get the results but I would put a message in that she is very concerned about what the next step is.Marland KitchenMarland Kitchen

## 2019-12-20 ENCOUNTER — Ambulatory Visit: Payer: 59 | Admitting: Podiatry

## 2019-12-20 NOTE — Progress Notes (Signed)
Thanks! I enjoy those! I'm seeing her tomorrow. I'll keep her updated. - Dr.Geneen Dieter

## 2019-12-20 NOTE — Telephone Encounter (Signed)
Dr. Geryl Rankins assistant, A. Prevette, CMA states Dr. Al Corpus would like pt to see Dr. Logan Bores to discuss this surgical procedure and have her come in the 12/20/2019 to discuss surgery. Pt states she was told she would have to have surgery. I told pt that I had reviewed her last clinical note and Dr. Al Corpus had discussed her MRI as being for surgical consideration. Transferred to scheduler for appt tomorrow in the late afternoon per Dr. Geryl Rankins recommendation per his assistant.

## 2019-12-21 ENCOUNTER — Other Ambulatory Visit: Payer: Self-pay

## 2019-12-21 ENCOUNTER — Telehealth: Payer: Self-pay

## 2019-12-21 ENCOUNTER — Ambulatory Visit: Payer: 59 | Admitting: Podiatry

## 2019-12-21 DIAGNOSIS — S92901A Unspecified fracture of right foot, initial encounter for closed fracture: Secondary | ICD-10-CM | POA: Diagnosis not present

## 2019-12-21 DIAGNOSIS — S92001A Unspecified fracture of right calcaneus, initial encounter for closed fracture: Secondary | ICD-10-CM | POA: Diagnosis not present

## 2019-12-21 NOTE — Patient Instructions (Signed)
Pre-Operative Instructions  Congratulations, you have decided to take an important step towards improving your quality of life.  You can be assured that the doctors and staff at [facility] will be with you every step of the way.  Here are some important things you should know:  1. Plan to be at the surgery center/hospital at least 1 (one) hour prior to your scheduled time, unless otherwise directed by the surgical center/hospital staff.  You must have a responsible adult accompany you, remain during the surgery and drive you home.  Make sure you have directions to the surgical center/hospital to ensure you arrive on time. 2. If you are having [facility], you will need a copy of your medical history and physical form from your family physician within one month prior to the date of surgery. We will give you a form for your primary physician to complete.  3. We make every effort to accommodate the date you request for surgery.  However, there are times where surgery dates or times have to be moved.  We will contact you as soon as possible if a change in schedule is required.   4. No aspirin/ibuprofen for one week before surgery.  If you are on aspirin, any non-steroidal anti-inflammatory medications (Mobic, Aleve, Ibuprofen) should not be taken seven (7) days prior to your surgery.  You make take Tylenol for pain prior to surgery.  5. Medications - If you are taking daily heart and blood pressure medications, seizure, reflux, allergy, asthma, anxiety, pain or diabetes medications, make sure you notify the surgery center/hospital before the day of surgery so they can tell you which medications you should take or avoid the day of surgery. 6. No food or drink after midnight the night before surgery unless directed otherwise by surgical center/hospital staff. 7. No alcoholic beverages 24-hours prior to surgery.  No smoking 24-hours prior or 24-hours after  surgery. 8. Wear loose pants or shorts. They should be loose enough to fit over bandages, boots, and casts. 9. Don't wear slip-on shoes. Sneakers are preferred. 10. Bring your boot with you to the surgery center/hospital.  Also bring crutches or a walker if your physician has prescribed it for you.  If you do not have this equipment, it will be provided for you after surgery. 11. If you have not been contacted by the surgery center/hospital by the day before your surgery, call to confirm the date and time of your surgery. 12. Leave-time from work may vary depending on the type of surgery you have.  Appropriate arrangements should be made prior to surgery with your employer. 13. Prescriptions will be provided immediately following surgery by your doctor.  Fill these as soon as possible after surgery and take the medication as directed. Pain medications will not be refilled on weekends and must be approved by the doctor. 14. Remove nail polish on the operative foot and avoid getting pedicures prior to surgery. 15. Wash the night before surgery.  The night before surgery wash the foot and leg well with water and the antibacterial soap provided. Be sure to pay special attention to beneath the toenails and in between the toes.  Wash for at least three (3) minutes. Rinse thoroughly with water and dry well with a towel.  Perform this wash unless told not to do so by your physician.  Enclosed: 1 Ice pack (please put in freezer the night before surgery)   1 Hibiclens skin cleaner     Pre-op instructions  If you have any questions regarding the instructions, please do not hesitate to call our office.  [place]: [address], [place], [place] [address] -- [phone]  [place]: [address] [place], [place] [address] -- [phone]  [place]: [address], [place], [place] [address] -- [phone]   Website: [email or web address] 

## 2019-12-21 NOTE — Telephone Encounter (Signed)
DOS 12/22/19  ORIF RT CALCANEAL - 02409  [employer] EFFECTIVE DATE 04/29/2016  PLAN DEDUCTIBLE - $6500 WITH $6080.03 REMAINING  OUT OF POCKET - $7500 W/ $7353.29 REMAINING  COPAY - $0.00  COINSURANCE  - 30%  PER [employer] AUTOMATED SYSTEM PRECERT IS NOT REQUIRED FOR CODE 92426. REF# [id number]

## 2019-12-22 ENCOUNTER — Encounter: Payer: Self-pay | Admitting: Podiatry

## 2019-12-22 ENCOUNTER — Other Ambulatory Visit: Payer: Self-pay | Admitting: Podiatry

## 2019-12-22 DIAGNOSIS — S92001A Unspecified fracture of right calcaneus, initial encounter for closed fracture: Secondary | ICD-10-CM | POA: Diagnosis not present

## 2019-12-22 MED ORDER — OXYCODONE-ACETAMINOPHEN 10-325 MG PO TABS: 1.0000 | ORAL_TABLET | ORAL | 0 refills | Status: DC | PRN

## 2019-12-22 MED ORDER — PROMETHAZINE HCL 25 MG PO TABS: 25.0000 mg | ORAL_TABLET | Freq: Three times a day (TID) | ORAL | 0 refills | Status: DC | PRN

## 2019-12-22 NOTE — Progress Notes (Signed)
Sent prescriptions for Dr. Logan Bores

## 2019-12-22 NOTE — Progress Notes (Unsigned)
PRN postop 

## 2019-12-23 ENCOUNTER — Telehealth: Payer: Self-pay | Admitting: *Deleted

## 2019-12-23 NOTE — Telephone Encounter (Signed)
Pt states Dr. Logan Bores forgot to send in the pain medication and she called the on-call-doctor, Dr. Allena Katz called in Oxycodone Acetaminophen, and she wanted to know if that is what Dr. Logan Bores would have ordered.

## 2019-12-23 NOTE — Telephone Encounter (Signed)
Thank you! - Dr. Tereka Thorley

## 2019-12-23 NOTE — Telephone Encounter (Signed)
I spoke with pt, and asked if she was in a boot or cast and she stated boot. I told pt our office rarely ordered greater than the Percocet 10/324, her pain medication was as Dr. Logan Bores would have ordered. I asked her to describe the pain and she stated stabbing. I told pt that was surgical pain and to take the percocet, phenergan as ordered and may take Ibuprofen 800mg  every 8 hours in between the narcotic pain medication to have a continuum of pain coverage.

## 2019-12-27 ENCOUNTER — Telehealth: Payer: Self-pay | Admitting: *Deleted

## 2019-12-27 NOTE — Telephone Encounter (Signed)
Pt states she is continuing to have a small amount of bleeding, has an appt tomorrow.

## 2019-12-27 NOTE — Progress Notes (Signed)
   HPI: 64 y.o. female presenting today for surgical consult and referral from Dr. Al Corpus for calcaneal fracture.  Patient was last seen on 12/15/2019 where CT scan was ordered and she was diagnosed with comminuted fracture of the right calcaneus.  This occurred when she stepped off of a two-step wooden stepstool.  DOI: 12/14/2019.  No new complaints at this time  Past Medical History:  Diagnosis Date  . Allergy   . Endometriosis      Physical Exam: General: The patient is alert and oriented x3 in no acute distress.  Dermatology: Skin is warm, dry and supple bilateral lower extremities. Negative for open lesions or macerations.  Vascular: Palpable pedal pulses bilaterally. No edema or erythema noted. Capillary refill within normal limits.  Moderate edema noted to the surgical foot.  No fracture blister identified  Neurological: Epicritic and protective threshold grossly intact bilaterally.   Musculoskeletal Exam: Ecchymosis with edema with pain associated to the right foot and heel  CT scan impression 12/16/2019: 1. Acute comminuted fracture of the right calcaneus. Intra-articular fracture extension to the posterior subtalar and calcaneocuboid joints. Posterior subtalar joint is mildly widened. 2. Tiny avulsion fracture fragments at the plantar aspect of the proximal cuboid. 3. Traversing peroneal, FDL, and FHL tendons closely approximate the fracture sites without obvious tendinous disruption.  Assessment: 1.  Acute comminuted fracture right calcaneus, closed   Plan of Care:  1. Patient evaluated.  CT reviewed.  2.  I explained to the patient she would benefit from surgical ORIF of the right calcaneus.  The patient agrees.  All possible complications and details the procedure were explained.  No guarantees were expressed or implied.  All patient questions answered. 3.  Authorization for surgery initiated today.  Surgery will consist of open reduction internal fixation right calcaneal  fracture 4.  Return to clinic 1 week postop      Felecia Shelling, DPM [facility]  Dr. Felecia Shelling, DPM    [address] [place], [place] [address]                Office [phone]  Fax [phone]

## 2019-12-27 NOTE — Telephone Encounter (Signed)
I spoke with pt and after differentiating that the dampness was only on the outer dressing and the dressing closes to the foot had only dry blood, I told pt to rest, and elevate and not be on the foot more than 15 minutes/hour.

## 2019-12-28 ENCOUNTER — Ambulatory Visit (INDEPENDENT_AMBULATORY_CARE_PROVIDER_SITE_OTHER): Payer: 59

## 2019-12-28 ENCOUNTER — Other Ambulatory Visit: Payer: Self-pay

## 2019-12-28 ENCOUNTER — Ambulatory Visit (INDEPENDENT_AMBULATORY_CARE_PROVIDER_SITE_OTHER): Payer: 59 | Admitting: Podiatry

## 2019-12-28 DIAGNOSIS — S92901A Unspecified fracture of right foot, initial encounter for closed fracture: Secondary | ICD-10-CM | POA: Diagnosis not present

## 2019-12-28 DIAGNOSIS — Z9889 Other specified postprocedural states: Secondary | ICD-10-CM

## 2019-12-28 MED ORDER — DOXYCYCLINE HYCLATE 100 MG PO TABS: 100.0000 mg | ORAL_TABLET | Freq: Two times a day (BID) | ORAL | 0 refills | Status: DC

## 2019-12-28 MED ORDER — PROMETHAZINE HCL 25 MG PO TABS: 25.0000 mg | ORAL_TABLET | Freq: Three times a day (TID) | ORAL | 0 refills | Status: AC | PRN

## 2019-12-28 MED ORDER — OXYCODONE-ACETAMINOPHEN 10-325 MG PO TABS: 1.0000 | ORAL_TABLET | ORAL | 0 refills | Status: DC | PRN

## 2020-01-02 ENCOUNTER — Other Ambulatory Visit: Payer: Self-pay

## 2020-01-02 ENCOUNTER — Ambulatory Visit (INDEPENDENT_AMBULATORY_CARE_PROVIDER_SITE_OTHER): Payer: 59 | Admitting: Podiatry

## 2020-01-02 ENCOUNTER — Telehealth: Payer: Self-pay | Admitting: Podiatry

## 2020-01-02 VITALS — Temp 96.4°F

## 2020-01-02 DIAGNOSIS — S92901A Unspecified fracture of right foot, initial encounter for closed fracture: Secondary | ICD-10-CM

## 2020-01-02 DIAGNOSIS — Z9889 Other specified postprocedural states: Secondary | ICD-10-CM

## 2020-01-02 NOTE — Telephone Encounter (Signed)
I have a postop appt on 04/12 but I have a little bleeding and I would like to come in to have the bandage changed. Its a little wet/moist. Thank you. Bye.

## 2020-01-02 NOTE — Telephone Encounter (Signed)
Pt is scheduled today at 3:15 p.m.

## 2020-01-04 NOTE — Progress Notes (Signed)
   Subjective:  Patient presents today status post ORIF right calcaneal fracture. DOS: 12/22/2019. She reports a significant amount of moderate pain. She has been taking pain medication for treatment and is requesting a refill. She has been using the CAM boot as directed. There are no modifying factors noted. Patient is here for further evaluation and treatment.    Past Medical History:  Diagnosis Date  . Allergy   . Endometriosis       Objective/Physical Exam Neurovascular status intact.  Skin incisions appear to be well coapted with sutures and staples intact. No sign of infectious process noted. No dehiscence. No active bleeding noted. Moderate edema noted to the surgical extremity.  Radiographic Exam:  Orthopedic hardware and osteotomies sites appear to be stable with routine healing.  Assessment: 1. s/p ORIF right calcaneal fracture. DOS: 12/22/2019   Plan of Care:  1. Patient was evaluated. X-rays reviewed 2. Dressing changed. Keep clean, dry and intact for one week.  3. Refill prescription for Percocet 10/325 mg provided to patient.  4. Refill prescription for Phenergan 25 mg provided to patient.  5. Prescription for Doxycycline 100 mg #20 provided to patient.  6. Continue nonweightbearing in CAM boot.  7. Return to clinic in one week.    Felecia Shelling, DPM [facility]  Dr. Felecia Shelling, DPM    [address]                                        [place], [place] [address]                Office [phone]  Fax [phone]

## 2020-01-05 NOTE — Progress Notes (Signed)
   Subjective:  Patient presents today status post ORIF right calcaneal fracture. DOS: 12/22/2019. She reports some bleeding through the bandage. She reports some moderate to severe pain that has since improved since onset. She believes the CAM boot is causing her symptoms. She has been nonweightbearing as directed. Patient is here for further evaluation and treatment.    Past Medical History:  Diagnosis Date  . Allergy   . Endometriosis       Objective/Physical Exam Neurovascular status intact.  Skin incisions appear to be well coapted with sutures and staples intact. No sign of infectious process noted. No dehiscence. No active bleeding noted. Moderate edema noted to the surgical extremity.  Radiographic Exam:  Orthopedic hardware and osteotomies sites appear to be stable with routine healing.  Assessment: 1. s/p ORIF right calcaneal fracture. DOS: 12/22/2019   Plan of Care:  1. Patient was evaluated. X-rays reviewed 2. Unna boot and dry sterile dressing applied.  3. Continue nonweightbearing in CAM boot.  4. Continue taking Doxycycline until finished.  5. Return to clinic in one week.    Felecia Shelling, DPM [facility]  Dr. Felecia Shelling, DPM    [address]                                        [place], [place] [address]                Office [phone]  Fax [phone]

## 2020-01-09 ENCOUNTER — Other Ambulatory Visit: Payer: Self-pay

## 2020-01-09 ENCOUNTER — Ambulatory Visit (INDEPENDENT_AMBULATORY_CARE_PROVIDER_SITE_OTHER): Payer: 59 | Admitting: Podiatry

## 2020-01-09 DIAGNOSIS — Z9889 Other specified postprocedural states: Secondary | ICD-10-CM

## 2020-01-09 DIAGNOSIS — S92901A Unspecified fracture of right foot, initial encounter for closed fracture: Secondary | ICD-10-CM

## 2020-01-09 MED ORDER — OXYCODONE-ACETAMINOPHEN 10-325 MG PO TABS: 1.0000 | ORAL_TABLET | ORAL | 0 refills | Status: AC | PRN

## 2020-01-09 MED ORDER — DOXYCYCLINE HYCLATE 100 MG PO TABS: 100.0000 mg | ORAL_TABLET | Freq: Two times a day (BID) | ORAL | 0 refills | Status: DC

## 2020-01-12 ENCOUNTER — Ambulatory Visit: Payer: 59 | Admitting: Podiatry

## 2020-01-16 ENCOUNTER — Ambulatory Visit (INDEPENDENT_AMBULATORY_CARE_PROVIDER_SITE_OTHER): Payer: 59

## 2020-01-16 ENCOUNTER — Ambulatory Visit (INDEPENDENT_AMBULATORY_CARE_PROVIDER_SITE_OTHER): Payer: 59 | Admitting: Podiatry

## 2020-01-16 ENCOUNTER — Other Ambulatory Visit: Payer: Self-pay

## 2020-01-16 ENCOUNTER — Encounter: Payer: Self-pay | Admitting: Podiatry

## 2020-01-16 DIAGNOSIS — S92001D Unspecified fracture of right calcaneus, subsequent encounter for fracture with routine healing: Secondary | ICD-10-CM

## 2020-01-16 DIAGNOSIS — Z9889 Other specified postprocedural states: Secondary | ICD-10-CM

## 2020-01-16 MED ORDER — DOXYCYCLINE HYCLATE 100 MG PO TABS: 100.0000 mg | ORAL_TABLET | Freq: Two times a day (BID) | ORAL | 0 refills | Status: DC

## 2020-01-16 NOTE — Progress Notes (Signed)
   Subjective:  Patient presents today status post ORIF right calcaneal fracture. DOS: 12/22/2019.  She continues to experience some pain and tenderness to the surgical site which is expected.  Past Medical History:  Diagnosis Date  . Allergy   . Endometriosis       Objective/Physical Exam Neurovascular status intact.  Skin incisions appear to be well coapted with sutures and staples intact with exception of a portion of the incision site that has dehisced with maceration.  Moderate drainage noted.  No malodor noted.  There is no exposed bone muscle tendon ligament or hardware.  Radiographic Exam:  Orthopedic hardware and osteotomies sites appear to be stable with routine healing.  Assessment: 1. s/p ORIF right calcaneal fracture. DOS: 12/22/2019 2.  Surgical dehiscence of incision site   Plan of Care:  1. Patient was evaluated. X-rays reviewed 2.  Today sutures and staples were removed 3.  Dry sterile dressing was applied 4.  Continue nonweightbearing in the cam boot 5.  Refill prescription for doxycycline 100 mg 6.  Return to clinic in 1 week   Felecia Shelling, DPM [facility]  Dr. Felecia Shelling, DPM    [address]                                        [place], [place] [address]                Office [phone]  Fax [phone]

## 2020-01-17 NOTE — Progress Notes (Signed)
   Subjective:  Patient presents today status post ORIF right calcaneal fracture. DOS: 12/22/2019.  Patient continues to have some drainage to the dressing.  She does states that the pain has declined. She has been nonweight bearing in the CAM boot.   Past Medical History:  Diagnosis Date  . Allergy   . Endometriosis       Objective/Physical Exam Neurovascular status intact.  Skin incisions appear to be well coapted with sutures and staples intact. No sign of infectious process noted. No dehiscence. No active bleeding noted. Moderate edema noted to the surgical extremity. There is some dehiscence noted to the posterior incision site with peri-incisional maceration and moderate drainage. No malodor. No exposed hardware.   Assessment: 1. s/p ORIF right calcaneal fracture. DOS: 12/22/2019 2. Dehiscence posterior incision site   Plan of Care:  1. Patient was evaluated.  2. Refill prescription for doxycycline 100mg  #20 3. Refill percocet 5/325mg   4. Cont NWB in CAM boot 5. Return to clinic in 1 week   , DPM [facility]  Dr. Felecia Shelling, DPM    [address]                                        [place], [place]                Office [phone]  Fax [phone]

## 2020-01-23 ENCOUNTER — Other Ambulatory Visit: Payer: Self-pay

## 2020-01-23 ENCOUNTER — Ambulatory Visit (INDEPENDENT_AMBULATORY_CARE_PROVIDER_SITE_OTHER): Payer: 59 | Admitting: Podiatry

## 2020-01-23 ENCOUNTER — Encounter: Payer: 59 | Admitting: Podiatry

## 2020-01-23 DIAGNOSIS — L97511 Non-pressure chronic ulcer of other part of right foot limited to breakdown of skin: Secondary | ICD-10-CM

## 2020-01-23 DIAGNOSIS — Z9889 Other specified postprocedural states: Secondary | ICD-10-CM

## 2020-01-23 DIAGNOSIS — S92001D Unspecified fracture of right calcaneus, subsequent encounter for fracture with routine healing: Secondary | ICD-10-CM

## 2020-01-26 LAB — WOUND CULTURE
MICRO NUMBER:: 10405483
SPECIMEN QUALITY:: ADEQUATE

## 2020-01-26 NOTE — Progress Notes (Signed)
   Subjective:  Patient presents today status post ORIF right calcaneal fracture. DOS: 12/22/2019. She reports some intermittent sharp pain. She reports some associated swelling. She has been taking Doxycycline as directed and has one week left. There are no modifying factors noted. Patient is here for further evaluation and treatment.   Past Medical History:  Diagnosis Date  . Allergy   . Endometriosis       Objective/Physical Exam Neurovascular status intact.  Skin incisions appear to be well coapted with exception of a portion of the incision site that has dehisced with maceration.  Area of dehiscence measuring approximately 4.0 x 1.5 x 0.6 cm. Moderate drainage noted.  No malodor noted.  There is no exposed bone muscle tendon ligament or hardware.  Assessment: 1. s/p ORIF right calcaneal fracture. DOS: 12/22/2019 2. Surgical dehiscence of incision site   Plan of Care:  1. Patient was evaluated. 2. Medically necessary excisional debridement including subcutaneous tissue was performed using a tissue nipper and a chisel blade. Excisional debridement of all the necrotic nonviable tissue down to healthy bleeding viable tissue was performed with post-debridement measurements same as pre-. 3. The wound was cleansed and Betadine wet-to-dry dressings applied. 4. Continue taking Doxycycline 100 mg provided to patient.  5. Culture taken from wound.  6. Continue nonweightbearing.  7. Return to clinic in one week.    Felecia Shelling, DPM [facility]  Dr. Felecia Shelling, DPM    [address]                                        [place], [place] [address]                Office [phone]  Fax [phone]

## 2020-01-30 ENCOUNTER — Other Ambulatory Visit: Payer: Self-pay

## 2020-01-30 ENCOUNTER — Ambulatory Visit (INDEPENDENT_AMBULATORY_CARE_PROVIDER_SITE_OTHER): Payer: 59 | Admitting: Podiatry

## 2020-01-30 DIAGNOSIS — S92001D Unspecified fracture of right calcaneus, subsequent encounter for fracture with routine healing: Secondary | ICD-10-CM

## 2020-01-30 DIAGNOSIS — Z9889 Other specified postprocedural states: Secondary | ICD-10-CM

## 2020-01-30 MED ORDER — DOXYCYCLINE HYCLATE 100 MG PO TABS: 100.0000 mg | ORAL_TABLET | Freq: Two times a day (BID) | ORAL | 0 refills | Status: AC

## 2020-02-01 NOTE — Progress Notes (Signed)
   Subjective:  Patient presents today status post ORIF right calcaneal fracture. DOS: 12/22/2019. She states she is doing well. She reports a decrease in redness and swelling. She reports taking Doxycycline with no significant improvement. There are no worsening factors noted at this time. Patient is here for further evaluation and treatment.   Past Medical History:  Diagnosis Date  . Allergy   . Endometriosis       Objective/Physical Exam Neurovascular status intact.  Skin incisions appear to be well coapted with exception of a portion of the incision site that has dehisced with maceration.  Area of dehiscence measuring approximately 3.5 x 1.0 x 0.4 cm. Moderate drainage noted.  No malodor noted.  There is no exposed bone muscle tendon ligament or hardware.  Assessment: 1. s/p ORIF right calcaneal fracture. DOS: 12/22/2019 2. Surgical dehiscence of incision site   Plan of Care:  1. Patient was evaluated. 2. Medically necessary excisional debridement including subcutaneous tissue was performed using a tissue nipper and a chisel blade. Excisional debridement of all the necrotic nonviable tissue down to healthy bleeding viable tissue was performed with post-debridement measurements same as pre-. 3. The wound was cleansed and dry sterile dressing applied.  4. Continue using Betadine with dry sterile dressing.  5. Refill prescription for Doxycycline.  6. Continue nonweightbearing.  7. Return to clinic in 2 weeks for follow up X-Ray and to begin weightbearing.    Felecia Shelling, DPM [facility]  Dr. Felecia Shelling, DPM    [address]                                        [place], [place] [address]                Office [phone]  Fax [phone]

## 2020-02-06 ENCOUNTER — Other Ambulatory Visit: Payer: Self-pay

## 2020-02-06 ENCOUNTER — Ambulatory Visit (INDEPENDENT_AMBULATORY_CARE_PROVIDER_SITE_OTHER): Payer: 59 | Admitting: Podiatry

## 2020-02-06 DIAGNOSIS — S92001D Unspecified fracture of right calcaneus, subsequent encounter for fracture with routine healing: Secondary | ICD-10-CM

## 2020-02-06 DIAGNOSIS — L97511 Non-pressure chronic ulcer of other part of right foot limited to breakdown of skin: Secondary | ICD-10-CM

## 2020-02-06 DIAGNOSIS — Z9889 Other specified postprocedural states: Secondary | ICD-10-CM

## 2020-02-06 MED ORDER — GENTAMICIN SULFATE 0.1 % EX CREA: 1.0000 "application " | TOPICAL_CREAM | Freq: Two times a day (BID) | CUTANEOUS | 1 refills | Status: AC

## 2020-02-06 MED ORDER — CIPROFLOXACIN HCL 500 MG PO TABS: 500.0000 mg | ORAL_TABLET | Freq: Two times a day (BID) | ORAL | 0 refills | Status: DC

## 2020-02-06 NOTE — Progress Notes (Signed)
   Subjective:  Patient presents today status post ORIF right calcaneal fracture. DOS: 12/22/2019.  Patient is currently on doxycycline 100 mg 2 times daily.  They have been changing the dressings at home with iodine and dry sterile dressing.  They began to be concerned over the weekend when she noticed an increased amount of drainage to the dehiscence site.  They present today for further treatment and evaluation   Past Medical History:  Diagnosis Date  . Allergy   . Endometriosis       Objective/Physical Exam Neurovascular status intact.  Skin incisions appear to be well coapted with exception of a portion of the incision site that has dehisced with maceration.  Area of dehiscence measuring approximately 3.5 x 1.0 x 0.4 cm.  There continues to be some moderate serous drainage from the ulcer site.  There continues to be no exposed bone or hardware.  There is no malodor noted.  There is also a small ulcer to the posterior aspect of the heel approximately 1 cm in diameter.  After debridement the wound base appears healthy and granular.  This is unassociated with the incision site.  Assessment: 1. s/p ORIF right calcaneal fracture. DOS: 12/22/2019 2. Surgical dehiscence of incision site   Plan of Care:  1. Patient was evaluated. 2. Medically necessary excisional debridement including subcutaneous tissue was performed using a tissue nipper and a chisel blade. Excisional debridement of all the necrotic nonviable tissue down to healthy bleeding viable tissue was performed with post-debridement measurements same as pre-. 3. The wound was cleansed and dry sterile dressing applied.  4.  The patient has multiple allergies to antibiotics and she has been on oral doxycycline because she can tolerate it.  Cultures were taken on 01/23/2020 and Cipro 500mg  appears to be most sensitive and effective, however she develops a rash. At the moment the benefits outweigh the risks of rash.  5. Rx Cipro 500 mg #20.   6. Rx Gentamicin Cream. Apply daily with DSD 7. RTC 2 weeks   , DPM [facility]  Dr. Felecia Shelling, DPM    [address]                                        [place], [place]                Office [phone]  Fax [phone]

## 2020-02-13 ENCOUNTER — Ambulatory Visit (INDEPENDENT_AMBULATORY_CARE_PROVIDER_SITE_OTHER): Payer: 59

## 2020-02-13 ENCOUNTER — Other Ambulatory Visit: Payer: Self-pay

## 2020-02-13 ENCOUNTER — Encounter: Payer: Self-pay | Admitting: Podiatry

## 2020-02-13 ENCOUNTER — Ambulatory Visit (INDEPENDENT_AMBULATORY_CARE_PROVIDER_SITE_OTHER): Payer: 59 | Admitting: Podiatry

## 2020-02-13 DIAGNOSIS — S92001D Unspecified fracture of right calcaneus, subsequent encounter for fracture with routine healing: Secondary | ICD-10-CM | POA: Diagnosis not present

## 2020-02-13 DIAGNOSIS — Z9889 Other specified postprocedural states: Secondary | ICD-10-CM

## 2020-02-13 DIAGNOSIS — L97511 Non-pressure chronic ulcer of other part of right foot limited to breakdown of skin: Secondary | ICD-10-CM

## 2020-02-13 MED ORDER — CIPROFLOXACIN HCL 500 MG PO TABS: 500.0000 mg | ORAL_TABLET | Freq: Two times a day (BID) | ORAL | 0 refills | Status: DC

## 2020-02-13 NOTE — Progress Notes (Signed)
   Subjective:  Patient presents today status post ORIF right calcaneal fracture. DOS: 12/22/2019.  Since last visit on 02/06/2020 she has been taking Cipro 500 mg 2 times daily.  This is tolerable she states.  Her husband has been changing the dressings every other day with Betadine.  He notices significant improvement.  She presents for further treatment and evaluation  Past Medical History:  Diagnosis Date  . Allergy   . Endometriosis       Objective/Physical Exam Neurovascular status intact.  Skin incisions appear to be well coapted with exception of a portion of the incision site that has dehisced.  Significant improvement noted.  Negative for drainage.  Reduced erythema and edema noted throughout the foot area of dehiscence measuring approximately 1.5 x 0.8 x 0.2 cm.  No exposed bone or hardware. There is also a small ulcer to the posterior aspect of the heel approximately 0.3 cm in diameter today with significant improvement.  After debridement the wound base appears healthy and granular.  This is unassociated and does not connect with the incision site.  Assessment: 1. s/p ORIF right calcaneal fracture. DOS: 12/22/2019 2. Surgical dehiscence of incision site   Plan of Care:  1. Patient was evaluated. 2. Medically necessary excisional debridement including subcutaneous tissue was performed using a tissue nipper and a chisel blade. Excisional debridement of all the necrotic nonviable tissue down to healthy bleeding viable tissue was performed with post-debridement measurements same as pre-. 3. The wound was cleansed and dry sterile dressing applied.  4.  Continue Cipro 500 mg 2 times daily.  Refill provided today 5.  Continue dressing changes every other day consisting of gentamicin cream and dry sterile dressing.  Dressing supplies were provided 6.  Return to clinic in 10 days  Felecia Shelling, DPM [facility]  Dr. Felecia Shelling, DPM    [address]                                         [place], [place] [address]                Office [phone]  Fax [phone]

## 2020-02-14 ENCOUNTER — Telehealth: Payer: Self-pay | Admitting: *Deleted

## 2020-02-14 MED ORDER — FLUCONAZOLE 150 MG PO TABS: ORAL_TABLET | ORAL | 0 refills | Status: DC

## 2020-02-14 NOTE — Telephone Encounter (Signed)
Dr. Maren Beach Diflucan 150mg  #2 take one now and one 3 days later. I informed pt of the orders.

## 2020-02-14 NOTE — Telephone Encounter (Signed)
Pt states she has been on an antibiotic since the surgery and has a yeast infection, would like Diflucan.

## 2020-02-22 ENCOUNTER — Other Ambulatory Visit: Payer: Self-pay

## 2020-02-22 ENCOUNTER — Ambulatory Visit: Payer: 59

## 2020-02-22 ENCOUNTER — Ambulatory Visit (INDEPENDENT_AMBULATORY_CARE_PROVIDER_SITE_OTHER): Payer: 59 | Admitting: Podiatry

## 2020-02-22 DIAGNOSIS — Z9889 Other specified postprocedural states: Secondary | ICD-10-CM

## 2020-02-22 DIAGNOSIS — S92001D Unspecified fracture of right calcaneus, subsequent encounter for fracture with routine healing: Secondary | ICD-10-CM

## 2020-02-22 NOTE — Progress Notes (Signed)
   Subjective:  Patient presents today status post ORIF right calcaneal fracture. DOS: 12/22/2019.  Patient states overall improvement.  She has noticed a decrease in the swelling and redness to the surgical foot.  She states that she has about 2 or 3 more days of the Cipro.  She is continued to notice slight drainage to the wound sites.  She presents for further treatment evaluation  Past Medical History:  Diagnosis Date  . Allergy   . Endometriosis       Objective/Physical Exam Neurovascular status intact.  Skin incisions appear to be well coapted with exception of a portion of the incision site that has dehisced along the linear incision.  There continues to be significant improvement noted.  Minimal drainage noted.  Reduced erythema and edema in the foot appears to clinically continue to improve.  Wound measures approximately 1.0 x 0.6 x 0.2 cm.  No exposed bone or hardware. There is also a small ulcer to the posterior aspect of the heel approximately 0.3 x 0.3x0.2 cm.  No malodor noted.  No exposed bone muscle tendon ligament or joint.  After debridement the wound base appears healthy and granular.  This small wound does not connect to the incisional dehiscence site.  Assessment: 1. s/p ORIF right calcaneal fracture. DOS: 12/22/2019 2. Surgical dehiscence of incision site 3.  Small wound posterior heel with fat layer exposed  Plan of Care:  1. Patient was evaluated. 2. Medically necessary excisional debridement including subcutaneous tissue was performed using a tissue nipper and a chisel blade. Excisional debridement of all the necrotic nonviable tissue down to healthy bleeding viable tissue was performed with post-debridement measurements same as pre-. 3. The wound was cleansed and dry sterile dressing applied.  4.  Continue Cipro 500 mg 2 times daily until finished. 5.  Continue dressing changes every other day consisting of alginate wound dressing that was provided today.  Dressing  supplies were provided 6.  Patient may also begin partial weightbearing in surgical shoe with assistance of a walker that the patient has at home.   7.  Return to clinic in 3 weeks.  We will initiate physical therapy once the wounds have healed  Natasha Johnson, DPM [facility]  Dr. Felecia Johnson, DPM    [address]                                        [place], [place] [address]                Office [phone]  Fax [phone]

## 2020-02-29 ENCOUNTER — Encounter: Payer: 59 | Admitting: Podiatry

## 2020-03-06 ENCOUNTER — Telehealth: Payer: Self-pay | Admitting: *Deleted

## 2020-03-06 NOTE — Telephone Encounter (Signed)
I spoke with Natasha Johnson and she states she has swelling and redness in the back of the leg, but not any pain, and the foot is painful to walk with the walker. I told Natasha Johnson to rest, and if elevate and if she should have heat and redness to the calf, chest pain or shortness of breath to get to the ED. I told Natasha Johnson I would inform Dr. Logan Bores.

## 2020-03-06 NOTE — Telephone Encounter (Signed)
Yes, please have her come into the office for evaluation. I'll fit her in. Thanks, Dr. Logan Bores

## 2020-03-06 NOTE — Telephone Encounter (Signed)
I informed pt to call tomorrow Dr. Logan Bores wanted to see her tomorrow.

## 2020-03-06 NOTE — Telephone Encounter (Signed)
Pt complains of swelling in the foot and leg and is using the walker.

## 2020-03-07 ENCOUNTER — Other Ambulatory Visit: Payer: Self-pay

## 2020-03-07 ENCOUNTER — Ambulatory Visit (INDEPENDENT_AMBULATORY_CARE_PROVIDER_SITE_OTHER): Payer: No Typology Code available for payment source | Admitting: Podiatry

## 2020-03-07 ENCOUNTER — Encounter: Payer: Self-pay | Admitting: Podiatry

## 2020-03-07 ENCOUNTER — Ambulatory Visit (INDEPENDENT_AMBULATORY_CARE_PROVIDER_SITE_OTHER): Payer: No Typology Code available for payment source

## 2020-03-07 DIAGNOSIS — Z9889 Other specified postprocedural states: Secondary | ICD-10-CM

## 2020-03-07 DIAGNOSIS — S92001D Unspecified fracture of right calcaneus, subsequent encounter for fracture with routine healing: Secondary | ICD-10-CM | POA: Diagnosis not present

## 2020-03-07 DIAGNOSIS — L97511 Non-pressure chronic ulcer of other part of right foot limited to breakdown of skin: Secondary | ICD-10-CM

## 2020-03-07 NOTE — Progress Notes (Signed)
Subjective:  Patient presents today status post ORIF right calcaneal fracture. DOS: 12/22/2019.  Patient states that on Saturday she did an increased amount of daily activities and walking and she noticed swelling with some increased redness extending up to her calf.  The patient was very concerned and she stated that the foot felt warm.  Currently she denies any fever chills nausea vomiting or shortness of breath.  Patient states that she woke up this morning and her foot seem to be much better with a decrease amount of swelling and redness to the area.  She was concerned however and came in today for evaluation.    Past Medical History:  Diagnosis Date  . Allergy   . Endometriosis       Objective/Physical Exam General: The patient is alert and oriented x3 in no acute distress.  Dermatology: Today the skin is cool, dry and supple bilateral lower extremities. Negative for open lesions or macerations with exception of the previously mentioned wound to the posterior heel.  After light debridement today the wound measures approximately 0.6 x 0.6 x 0.1 cm.  This wound is very superficial.  Minimal drainage noted.  The wound to the lateral incision is healed completely.  Complete reepithelialization has occurred.  Vascular: Palpable pedal pulses bilaterally.  Minimal to moderate edema noted.  Today there is no erythema or warmth associated to the foot ankle or leg.  Capillary refill within normal limits.  Neurological: Epicritic and protective threshold grossly intact bilaterally.   Musculoskeletal Exam: Status post ORIF right calcaneal fracture.  Negative for any significant pain on palpation throughout the right foot and ankle  Radiographic exam: Orthopedic hardware appears to be stable without movement.  Healing appropriately.  There is a vertical fracture line at the calcaneus that extends intra-articularly between the posterior and middle facets.  Hardware is stable and holding in place  however it appears to be not completely healed. There is also some cortical irregularity to the posterior tubercle of the calcaneus.  Clinically there is no sign of infection and I do not believe this is related to osteomyelitis and more of a posterior heel spur.   Assessment: 1. s/p ORIF right calcaneal fracture. DOS: 12/22/2019 2. Surgical dehiscence of incision site-healed 3.  Small wound posterior heel with fat layer exposed  Plan of Care:  1. Patient was evaluated. 2.  Light debridement of the posterior heel ulcer was performed using a tissue nipper.  Granulation tissue appeared healthy and viable.  No purulent drainage noted.   3.  Recommend Betadine ointment and a Band-Aid overlying the posterior heel ulcer.  Again this is very superficial and does not probe deeply. 4.  Patient recently completed her ciprofloxacin 500 mg over a week ago.  We are not going to represcribe antibiotics as I believe the swelling and warmth came from excessive activity 5.  We will initiate physical therapy today.  [facility] at [facility].  Patient already has a follow-up appointment in 2 weeks.  Return to clinic in 2 weeks  Felecia Shelling, DPM [facility]  Dr. Felecia Shelling, DPM    [address]                                        [place], [place] [address]  Office [phone]  Fax [phone]

## 2020-03-12 ENCOUNTER — Other Ambulatory Visit: Payer: Self-pay | Admitting: Podiatry

## 2020-03-19 ENCOUNTER — Other Ambulatory Visit: Payer: Self-pay

## 2020-03-19 ENCOUNTER — Ambulatory Visit (INDEPENDENT_AMBULATORY_CARE_PROVIDER_SITE_OTHER): Payer: No Typology Code available for payment source | Admitting: Podiatry

## 2020-03-19 ENCOUNTER — Encounter: Payer: Self-pay | Admitting: Podiatry

## 2020-03-19 DIAGNOSIS — L97511 Non-pressure chronic ulcer of other part of right foot limited to breakdown of skin: Secondary | ICD-10-CM

## 2020-03-19 DIAGNOSIS — S92001D Unspecified fracture of right calcaneus, subsequent encounter for fracture with routine healing: Secondary | ICD-10-CM

## 2020-03-19 DIAGNOSIS — Z9889 Other specified postprocedural states: Secondary | ICD-10-CM

## 2020-03-19 NOTE — Progress Notes (Signed)
° °  Subjective:  Patient presents today status post ORIF right calcaneal fracture. DOS: 12/22/2019.  Last visit on 03/07/2020 she was concerned for swelling and warmth to the area however she has not had any issues associated to swelling and warmth for the past 2 weeks.  Overall doing very well.  She has not started physical therapy at the moment.  She has been applying a bandage to the posterior heel ulcer.  No new complaints at this time  Past Medical History:  Diagnosis Date   Allergy    Endometriosis       Objective/Physical Exam General: The patient is alert and oriented x3 in no acute distress.  Dermatology: Today the skin is cool, dry and supple bilateral lower extremities. Negative for open lesions or macerations with exception of the previously mentioned wound to the posterior heel.  After light debridement today the wound measures approximately 0.6 x 0.6 x 0.1 cm.  This wound is very superficial.  Minimal drainage noted.  The wound to the lateral incision is healed completely.  Complete reepithelialization has occurred.  Vascular: Palpable pedal pulses bilaterally.  Minimal edema noted.  Today there is no erythema or warmth associated to the foot ankle or leg.  Capillary refill within normal limits.  Neurological: Epicritic and protective threshold grossly intact bilaterally.   Musculoskeletal Exam: Status post ORIF right calcaneal fracture.  Negative for any significant pain on palpation throughout the right foot and ankle  Radiographic exam: Orthopedic hardware appears to be stable without movement.  Healing appropriately.  There is a vertical fracture line at the calcaneus that extends intra-articularly between the posterior and middle facets.  Hardware is stable and holding in place however it appears to be not completely healed. There is also some cortical irregularity to the posterior tubercle of the calcaneus.  Clinically there is no sign of infection and I do not believe this  is related to osteomyelitis and more of a posterior heel spur.   Assessment: 1. s/p ORIF right calcaneal fracture. DOS: 12/22/2019 2. Surgical dehiscence of incision site-healed 3.  Small wound posterior heel with fat layer exposed  Plan of Care:  1. Patient was evaluated. 2.  Light debridement of the posterior heel ulcer was performed using a tissue nipper.  Granulation tissue appeared healthy and viable.  No purulent drainage noted.   3.  Hydrocolloid wound gel provided for the patient.  Apply daily with a Band-Aid 4.  Continue Ace wrap daily 5.  Physical therapy pending.  Patient will call and make an appointment 6.  Return to clinic in 4 weeks  Felecia Shelling, DPM [facility]  Dr. Felecia Shelling, DPM    [address]                                        [place], [place] [address]                Office [phone]  Fax [phone]

## 2020-03-22 ENCOUNTER — Telehealth: Payer: Self-pay | Admitting: Podiatry

## 2020-03-22 DIAGNOSIS — Z9889 Other specified postprocedural states: Secondary | ICD-10-CM

## 2020-03-22 DIAGNOSIS — L97511 Non-pressure chronic ulcer of other part of right foot limited to breakdown of skin: Secondary | ICD-10-CM

## 2020-03-22 DIAGNOSIS — S92001D Unspecified fracture of right calcaneus, subsequent encounter for fracture with routine healing: Secondary | ICD-10-CM

## 2020-03-22 NOTE — Telephone Encounter (Signed)
Delivered referral to [facility]. 

## 2020-03-22 NOTE — Telephone Encounter (Signed)
Natasha Johnson at [facility] needs the referral she stated she did not have it  Please assist

## 2020-04-16 ENCOUNTER — Other Ambulatory Visit: Payer: Self-pay

## 2020-04-16 ENCOUNTER — Ambulatory Visit (INDEPENDENT_AMBULATORY_CARE_PROVIDER_SITE_OTHER): Payer: No Typology Code available for payment source | Admitting: Podiatry

## 2020-04-16 DIAGNOSIS — L97511 Non-pressure chronic ulcer of other part of right foot limited to breakdown of skin: Secondary | ICD-10-CM

## 2020-04-16 DIAGNOSIS — S92001D Unspecified fracture of right calcaneus, subsequent encounter for fracture with routine healing: Secondary | ICD-10-CM

## 2020-04-16 DIAGNOSIS — Z9889 Other specified postprocedural states: Secondary | ICD-10-CM

## 2020-04-16 MED ORDER — MELOXICAM 15 MG PO TABS: 15.0000 mg | ORAL_TABLET | Freq: Every day | ORAL | 1 refills | Status: DC

## 2020-04-16 NOTE — Progress Notes (Signed)
   Subjective:  Patient presents today status post ORIF right calcaneal fracture. DOS: 12/22/2019.  Patient states that overall she is doing much better.  She is slowly improving and physical therapy is helping significantly.  Today she presents wearing a postsurgical shoe and walking with a cane for assistance.  She also has been applying Ace wraps.  She presents for further treatment evaluation.  No new complaints at this time  Past Medical History:  Diagnosis Date  . Allergy   . Endometriosis       Objective/Physical Exam General: The patient is alert and oriented x3 in no acute distress.  Dermatology: Today the skin is cool, dry and supple bilateral lower extremities.  There continues to be a very small superficial ulcer noted to the posterior heel.  After light debridement today the wound measures approximately 0.6 x 0.6 x 0.1 cm.  This wound is very superficial.  Minimal drainage noted.  Periwound integrity is intact.  There is no erythema or odor.  The wound to the lateral incision is healed completely.  Complete reepithelialization has occurred.  Vascular: Palpable pedal pulses bilaterally.  Minimal edema noted.  Today there is no erythema or warmth associated to the foot ankle or leg.  Capillary refill within normal limits.  Neurological: Epicritic and protective threshold grossly intact bilaterally.   Musculoskeletal Exam: Status post ORIF right calcaneal fracture.  Negative for any significant pain on palpation throughout the right foot and ankle  Assessment: 1. s/p ORIF right calcaneal fracture. DOS: 12/22/2019 2. Small wound posterior heel with fat layer exposed  Plan of Care:  1. Patient was evaluated. 2.  Light debridement of the posterior heel ulcer was performed using a tissue nipper.  Granulation tissue appeared healthy and viable.  No purulent drainage noted.   3.  Prisma collagen provided for the patient.  Apply daily with a Band-Aid 4.  Compression anklet dispensed.   Wear daily 5.  Continue physical therapy at [facility], [place] 6.  Discontinue postsurgical shoe.  Recommend good supportive sneakers 7.  Return to clinic in 1 month for follow-up x-ray  Felecia Shelling, DPM [facility]  Dr. Felecia Shelling, DPM    [address]                                        [place], [place] [address]                Office [phone]  Fax [phone]

## 2020-05-16 ENCOUNTER — Ambulatory Visit (INDEPENDENT_AMBULATORY_CARE_PROVIDER_SITE_OTHER): Payer: Medicare HMO

## 2020-05-16 ENCOUNTER — Ambulatory Visit (INDEPENDENT_AMBULATORY_CARE_PROVIDER_SITE_OTHER): Payer: Medicare HMO | Admitting: Podiatry

## 2020-05-16 ENCOUNTER — Other Ambulatory Visit: Payer: Self-pay

## 2020-05-16 DIAGNOSIS — L97511 Non-pressure chronic ulcer of other part of right foot limited to breakdown of skin: Secondary | ICD-10-CM | POA: Diagnosis not present

## 2020-05-16 DIAGNOSIS — S92001D Unspecified fracture of right calcaneus, subsequent encounter for fracture with routine healing: Secondary | ICD-10-CM | POA: Diagnosis not present

## 2020-05-16 DIAGNOSIS — Z9889 Other specified postprocedural states: Secondary | ICD-10-CM | POA: Diagnosis not present

## 2020-05-16 NOTE — Progress Notes (Addendum)
   Subjective:  Patient presents today status post ORIF right calcaneal fracture. DOS: 12/22/2019.  Patient states that she is doing very well.  She is able to walk without pain for approximately 2-3 hours.  After 2-3 hours she starts to get some achiness to her foot.  She has been going to physical therapy which is helped tremendously.  No new complaints at this time  Past Medical History:  Diagnosis Date  . Allergy   . Endometriosis       Objective/Physical Exam General: The patient is alert and oriented x3 in no acute distress.  Dermatology: Today the skin is cool, dry and supple bilateral lower extremities.  Superficial ulcer to the posterior heel has completely healed.  Complete reepithelialization has occurred.    Vascular: Palpable pedal pulses bilaterally.  Minimal edema noted.  Today there is no erythema or warmth associated to the foot ankle or leg.  Capillary refill within normal limits.  Neurological: Epicritic and protective threshold grossly intact bilaterally.   Musculoskeletal Exam: Status post ORIF right calcaneal fracture.  Negative for any significant pain on palpation throughout the right foot and ankle  Assessment: 1. s/p ORIF right calcaneal fracture. DOS: 12/22/2019 2. Small wound posterior heel with fat layer exposed - healed  Plan of Care:  1. Patient was evaluated. 2.  From a surgical standpoint patient may resume full activity no restrictions 3.  Recommend compression ankle sleeve daily 4.  Continue physical therapy at [facility] as needed 5.  Continue Meloxicam PRN. Refill sent to pharmacy 6.  Return to clinic as needed  Felecia Shelling, DPM [facility]  Dr. Felecia Shelling, DPM    [address]                                        [place], [place] [address]                Office [phone]  Fax [phone]

## 2020-05-18 ENCOUNTER — Telehealth: Payer: Self-pay | Admitting: Podiatry

## 2020-05-18 NOTE — Telephone Encounter (Signed)
I am not sure what medication either.  It might be better to reach out to him for this.  Thank you

## 2020-05-18 NOTE — Telephone Encounter (Signed)
Pt called and stated that Dr.Evans was suppose to call in a medication for her I did not see in chart please advise

## 2020-05-21 ENCOUNTER — Ambulatory Visit (INDEPENDENT_AMBULATORY_CARE_PROVIDER_SITE_OTHER): Payer: Medicare HMO

## 2020-05-21 ENCOUNTER — Other Ambulatory Visit: Payer: Self-pay | Admitting: Podiatry

## 2020-05-21 ENCOUNTER — Other Ambulatory Visit: Payer: Self-pay

## 2020-05-21 ENCOUNTER — Ambulatory Visit: Payer: Medicare HMO | Admitting: Podiatry

## 2020-05-21 DIAGNOSIS — L97411 Non-pressure chronic ulcer of right heel and midfoot limited to breakdown of skin: Secondary | ICD-10-CM

## 2020-05-21 DIAGNOSIS — Z9889 Other specified postprocedural states: Secondary | ICD-10-CM

## 2020-05-21 DIAGNOSIS — L97412 Non-pressure chronic ulcer of right heel and midfoot with fat layer exposed: Secondary | ICD-10-CM

## 2020-05-21 DIAGNOSIS — S9031XA Contusion of right foot, initial encounter: Secondary | ICD-10-CM | POA: Diagnosis not present

## 2020-05-21 DIAGNOSIS — S90121A Contusion of right lesser toe(s) without damage to nail, initial encounter: Secondary | ICD-10-CM | POA: Diagnosis not present

## 2020-05-21 DIAGNOSIS — S92001D Unspecified fracture of right calcaneus, subsequent encounter for fracture with routine healing: Secondary | ICD-10-CM

## 2020-05-21 MED ORDER — NAPROXEN 500 MG PO TABS: 500.0000 mg | ORAL_TABLET | Freq: Two times a day (BID) | ORAL | 3 refills | Status: DC

## 2020-05-21 MED ORDER — MELOXICAM 15 MG PO TABS: 15.0000 mg | ORAL_TABLET | Freq: Every day | ORAL | 1 refills | Status: DC

## 2020-05-21 MED ORDER — CIPROFLOXACIN HCL 500 MG PO TABS: 500.0000 mg | ORAL_TABLET | Freq: Two times a day (BID) | ORAL | 0 refills | Status: AC

## 2020-05-21 NOTE — Progress Notes (Signed)
   HPI: 64 y.o. female presenting today for evaluation of a recurrence of the wound that developed to the posterior aspect of the right heel.  The patient was only last seen in the office on 05/16/2020, 5 days ago, and she was doing very well.  At that time she was discharged to full activity no restrictions.  She states that since her last visit she wore the cam boot for a few days which caused rubbing and irritation to the posterior aspect of the heel.  She states the blister arose and she presents today with a tender wound to the posterior aspect of the heel.  This is the same location she has had wounds develop in the past postsurgically.  Past Medical History:  Diagnosis Date  . Allergy   . Endometriosis      Physical Exam: General: The patient is alert and oriented x3 in no acute distress.  Dermatology: Ulcer noted to the posterior aspect of the right heel measuring approximately 1.0 x 1.3 x 0.2 cm.  To the noted ulceration there is no eschar.  There is a minimal amount of slough fibrin and necrotic tissue noted.  No malodor noted.  There is no exposed bone muscle tendon ligament or joint.  No malodor noted.  Periwound integrity is intact with no significant erythema extending beyond the perimeter of the wound.  Vascular: Palpable pedal pulses bilaterally. No edema or erythema noted. Capillary refill within normal limits.  Neurological: Epicritic and protective threshold grossly intact bilaterally.   Musculoskeletal Exam: Range of motion within normal limits to all pedal and ankle joints bilateral. Muscle strength 5/5 in all groups bilateral.   Radiographic Exam:  Normal osseous mineralization.  Orthopedic hardware at the calcaneus is intact.  There is not appear to be any cortical irregularity or pathological cortical disruption of the posterior tubercle of the calcaneus.  Assessment: 1.  Ulcer right posterior heel   Plan of Care:  1. Patient evaluated. X-Rays reviewed.  2.   Medically necessary excisional debridement including subcutaneous tissue was performed using a tissue nipper.  Excisional debridement of all necrotic nonviable tissue down to healthy bleeding viable tissue was performed with post debridement measurement same as pre-. 3.  Prisma Ag wound dressings were provided for the patient and applied today.  Apply daily 4.  Cultures were taken and sent to pathology for culture and sensitivity  5.  Prescription for ciprofloxacin 500 mg 2 times daily x10 days 6.  Prescription for naproxen 500 mg 2 times daily 7.  Return to clinic in 10 days      Felecia Shelling, DPM [facility]  Dr. Felecia Shelling, DPM    [address] [place], [place] [address]                Office [phone]  Fax [phone]

## 2020-05-21 NOTE — Telephone Encounter (Signed)
Rx for meloxicam sent to pharmacy. Thanks, Dr. Logan Bores

## 2020-05-21 NOTE — Addendum Note (Signed)
Addended by: Felecia Shelling on: 05/21/2020 07:44 AM   Modules accepted: Orders

## 2020-05-24 LAB — WOUND CULTURE
MICRO NUMBER:: 10861151
SPECIMEN QUALITY:: ADEQUATE

## 2020-05-24 LAB — HOUSE ACCOUNT TRACKING

## 2020-05-25 ENCOUNTER — Other Ambulatory Visit: Payer: Self-pay | Admitting: Podiatry

## 2020-05-27 NOTE — Telephone Encounter (Signed)
Please advise 

## 2020-05-30 ENCOUNTER — Ambulatory Visit (INDEPENDENT_AMBULATORY_CARE_PROVIDER_SITE_OTHER): Payer: Medicare HMO | Admitting: Podiatry

## 2020-05-30 ENCOUNTER — Ambulatory Visit: Payer: Medicare HMO | Admitting: Podiatry

## 2020-05-30 ENCOUNTER — Encounter: Payer: Self-pay | Admitting: Podiatry

## 2020-05-30 ENCOUNTER — Other Ambulatory Visit: Payer: Self-pay

## 2020-05-30 DIAGNOSIS — S92001D Unspecified fracture of right calcaneus, subsequent encounter for fracture with routine healing: Secondary | ICD-10-CM | POA: Diagnosis not present

## 2020-05-30 DIAGNOSIS — Z9889 Other specified postprocedural states: Secondary | ICD-10-CM | POA: Diagnosis not present

## 2020-05-30 DIAGNOSIS — L97412 Non-pressure chronic ulcer of right heel and midfoot with fat layer exposed: Secondary | ICD-10-CM | POA: Diagnosis not present

## 2020-05-30 NOTE — Progress Notes (Signed)
  Subjective:  Patient ID: Natasha Johnson, female    DOB: [birth year],  MRN: [id number]  Chief Complaint  Patient presents with  . Foot Ulcer    right foot, heel is still sore,red,scabby area , requesting 2nd opinion ,discuss wound culture findings    64 y.o. female presents with the above complaint. History confirmed with patient.  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  At last visit Dr. Logan Bores debrided the wound and they have been putting Prisma in the heel.  She has been taking ciprofloxacin 500 mg daily.  She has 4 days left.  Objective:  Physical Exam: warm, good capillary refill, no trophic changes or ulcerative lesions, normal DP and PT pulses and normal sensory exam.   Right Foot:  Well-healed surgical incision for calcaneal ORIF, the posterior heel has a 3 mm x 2 mm x 3 mm sinus tract into the dermis and subcutaneous tissue.  Mild periwound erythema, no cellulitis.      Assessment:   1. Ulcer of right heel and midfoot with fat layer exposed (HCC)   2. Status post right foot surgery   3. Closed nondisplaced fracture of right calcaneus with routine healing, unspecified portion of calcaneus, subsequent encounter      Plan:  Patient was evaluated and treated and all questions answered.  Ulcer right posterior heel -Debridement as below. -Dressed with Prisma, DSD. -Continue off-loading with surgical shoe. -Continue antibiotics to finish course  Procedure: Excisional Debridement of Wound Rationale: Removal of non-viable soft tissue from the wound to promote healing.  Anesthesia: none Pre-Debridement Wound Measurements: 0.3 cm x 0.2 cm x 0.3 cm  Post-Debridement Wound Measurements: 0.3 cm x 0.2 cm x 0.3 cm  Type of Debridement: Sharp selective Tissue Removed: Non-viable soft tissue Depth of Debridement: subcutaneous tissue. Technique: Sharp selective debridement to bleeding, viable wound base.  Dressing: Dry, sterile, compression  dressing. Disposition: Patient tolerated procedure well. Patient to return in 1 week for follow-up.  Return in about 1 week (around 06/06/2020) for wound re-check with Dr Logan Bores or myself if he is out.

## 2020-05-30 NOTE — Patient Instructions (Signed)
Apply the Prisma small amount daily with a sterile bandage to cover it  Monitor for any signs/symptoms of infection. Signs of an infection could be redness beyond the site of the incision/procedure/wound, foul smelling odor, drainage that is thick and yellow or green, or severe swelling and pain. Call the office immediately if any occur or go directly to the emergency room. Call with any questions/concerns.

## 2020-06-13 ENCOUNTER — Ambulatory Visit (INDEPENDENT_AMBULATORY_CARE_PROVIDER_SITE_OTHER): Payer: Medicare HMO | Admitting: Podiatry

## 2020-06-13 ENCOUNTER — Other Ambulatory Visit: Payer: Self-pay

## 2020-06-13 DIAGNOSIS — Z9889 Other specified postprocedural states: Secondary | ICD-10-CM | POA: Diagnosis not present

## 2020-06-13 DIAGNOSIS — L97412 Non-pressure chronic ulcer of right heel and midfoot with fat layer exposed: Secondary | ICD-10-CM | POA: Diagnosis not present

## 2020-06-17 NOTE — Progress Notes (Signed)
   HPI: 64 y.o. female presenting today for follow-up evaluation of an ulcer to the right posterior heel.  Patient states that she believes the wound has completely healed as of yesterday.  Negative for redness or swelling.  She presents for follow-up treatment and evaluation.  She has been applying Prisma wound dressing and a Band-Aid daily.  Past Medical History:  Diagnosis Date  . Allergy   . Endometriosis      Physical Exam: General: The patient is alert and oriented x3 in no acute distress.  Dermatology: Ulcer noted to the posterior aspect of the right heel has healed.  Complete reepithelialization has occurred.  There is no drainage.  No odor.  Periwound integrity is intact with no significant erythema.  Vascular: Palpable pedal pulses bilaterally. No edema or erythema noted. Capillary refill within normal limits.  Neurological: Epicritic and protective threshold grossly intact bilaterally.   Musculoskeletal Exam: Range of motion within normal limits to all pedal and ankle joints bilateral. Muscle strength 5/5 in all groups bilateral.   Assessment: 1.  Ulcer right posterior heel-healed 2.  H/O ORIF calcaneus right.  DOS: 12/22/2019   Plan of Care:  1. Patient evaluated.  2.  Patient's wound has healed.  She may now resume full activity with no restrictions.  I do believe the wound was a recurrence of irritation from the cam boot.  Recommend good supportive shoes and sneakers that do not irritate the posterior heel 3.  Silicone heel sleeve provided today.  Wear daily 4.  Return to clinic as needed     Felecia Shelling, DPM [facility]  Dr. Felecia Shelling, DPM    [address] [place], [place] [address]                Office [phone]  Fax [phone]

## 2020-09-26 ENCOUNTER — Encounter: Payer: Self-pay | Admitting: Podiatry

## 2020-09-27 ENCOUNTER — Other Ambulatory Visit: Payer: Self-pay | Admitting: Podiatry

## 2020-10-07 NOTE — Telephone Encounter (Signed)
Please advise 

## 2021-02-14 ENCOUNTER — Other Ambulatory Visit: Payer: Self-pay | Admitting: Podiatry

## 2021-05-27 IMAGING — US US ABDOMEN LIMITED
1 series · 14 of 25 positions shown · non-contrast
Comparison: 05/24/2015

CLINICAL DATA: Primary cholangitis, epigastric discomfort

EXAM:
ULTRASOUND ABDOMEN LIMITED RIGHT UPPER QUADRANT

[Series 1: us abdomen limited · 0.19mm/px · 14 of 60 slices shown]
[im 1/60]
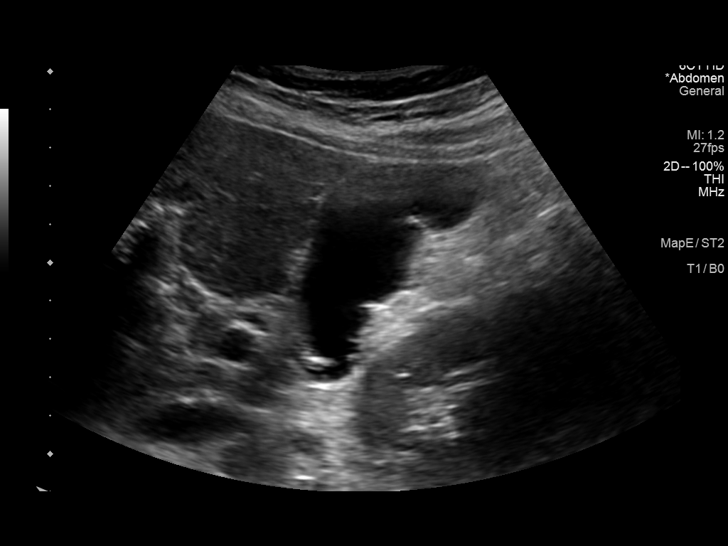
[im 5/60]
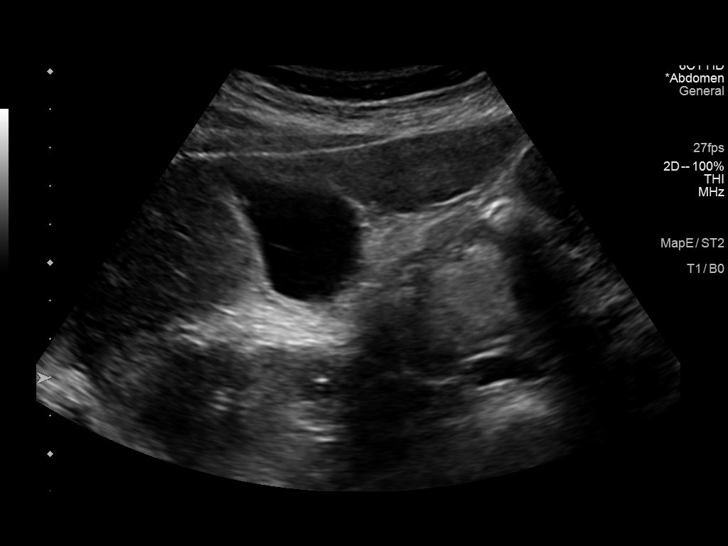
[im 10/60]
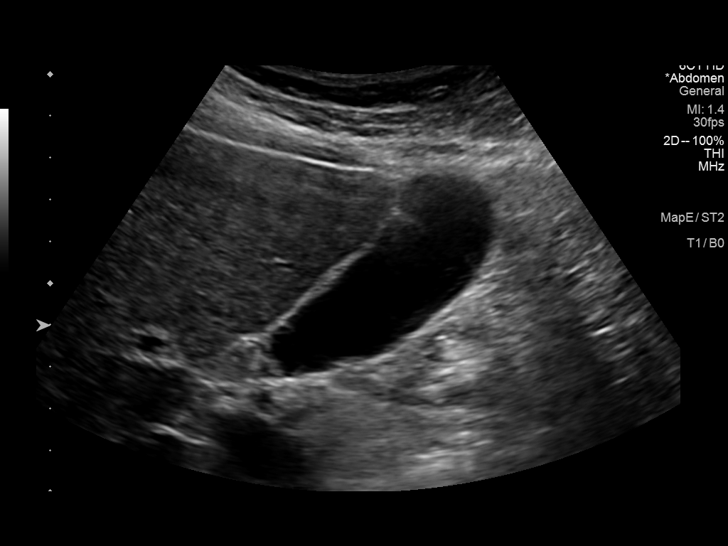
[im 15/60]
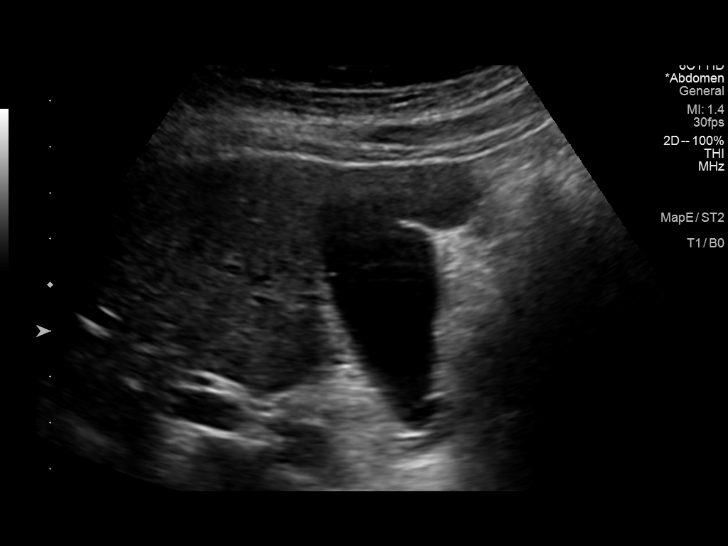
[im 20/60]
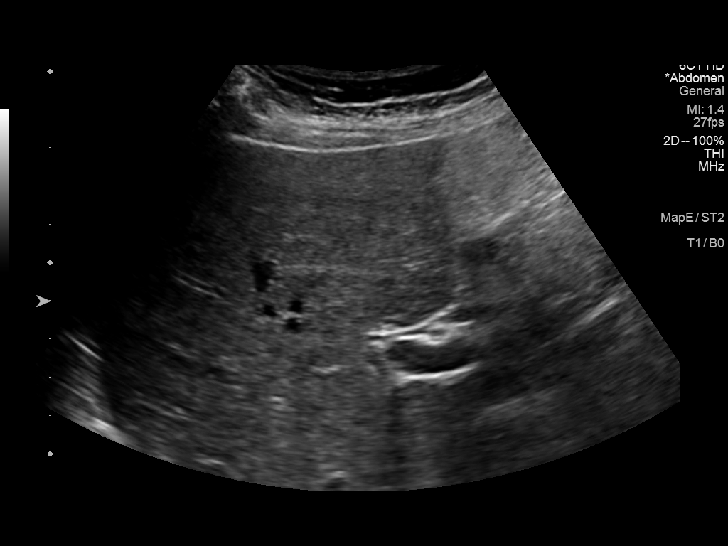
[im 23/60]
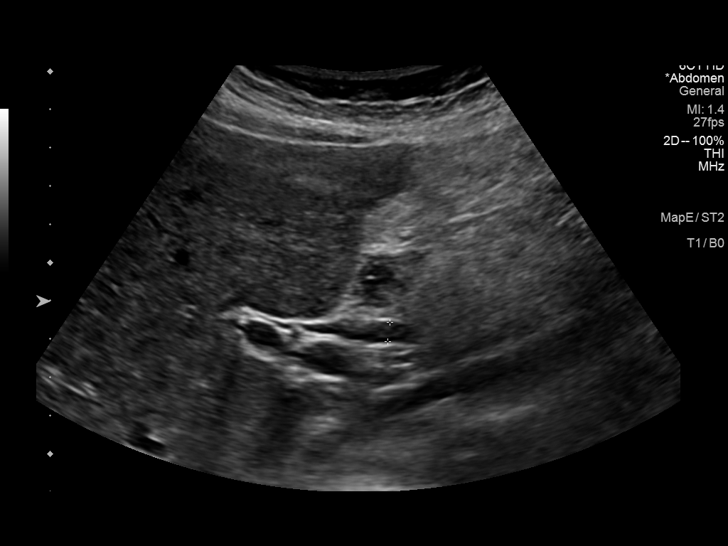
[im 28/60]
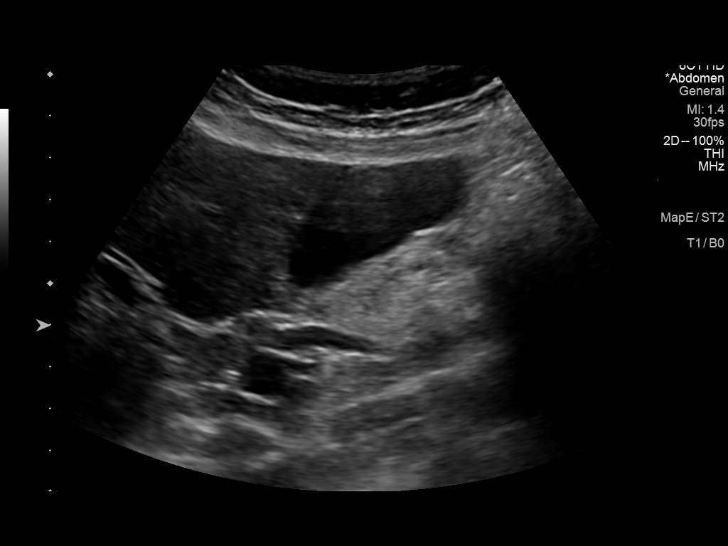
[im 32/60]
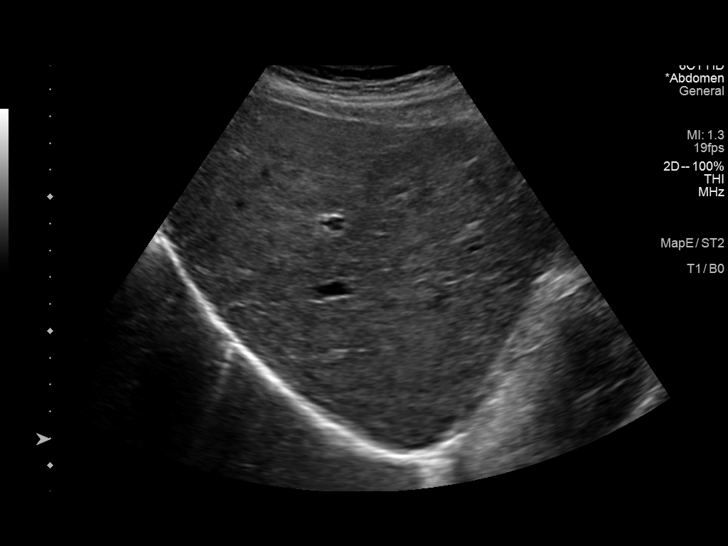
[im 37/60]
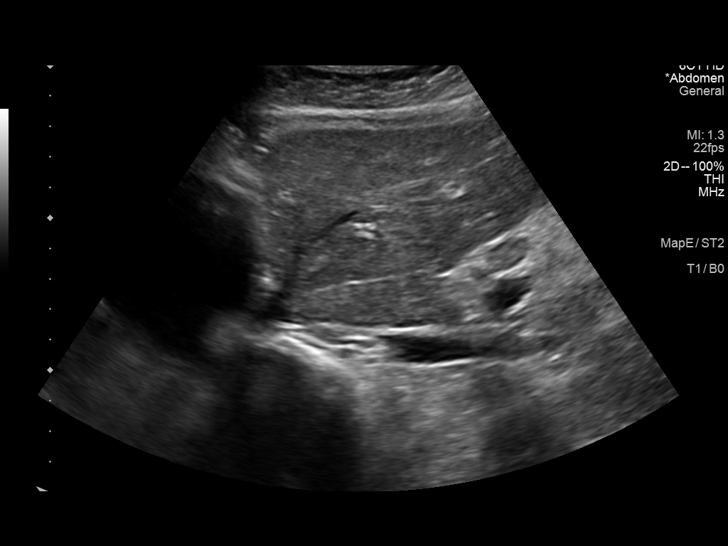
[im 40/60]
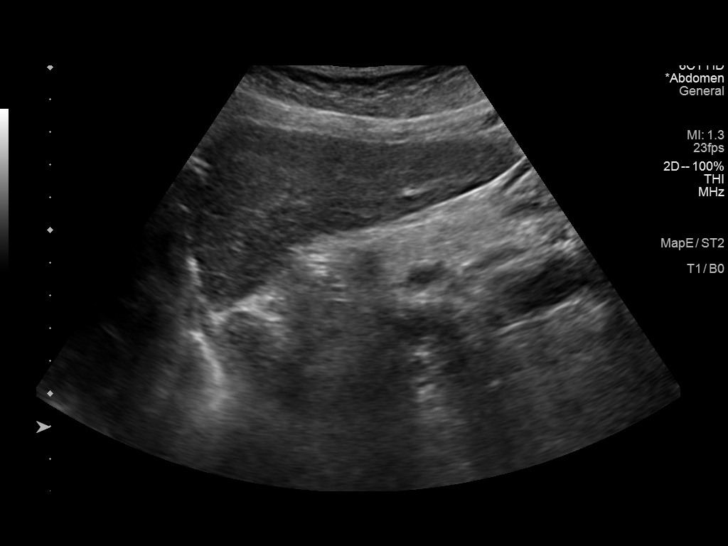
[im 45/60]
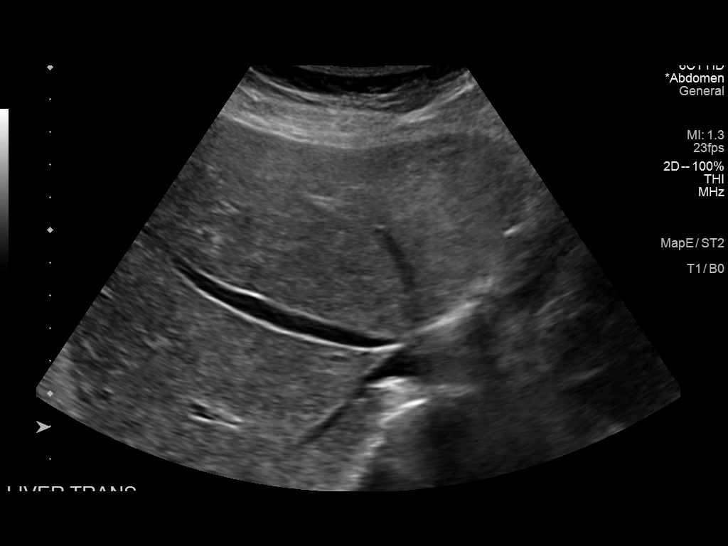
[im 50/60]
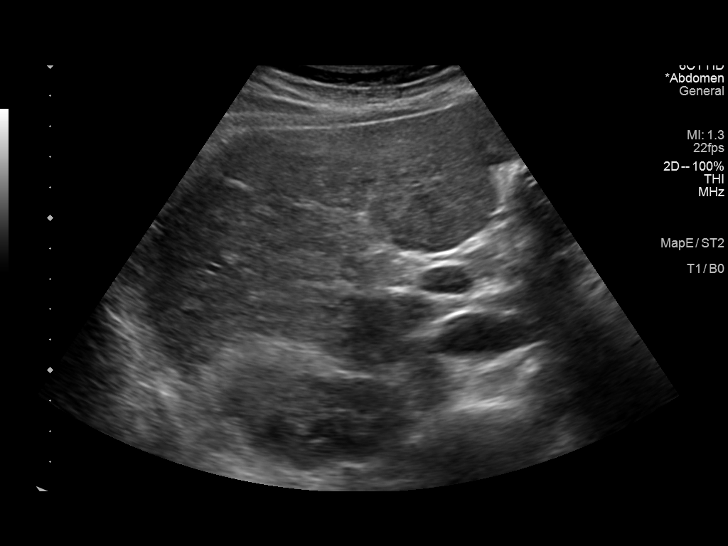
[im 55/60]
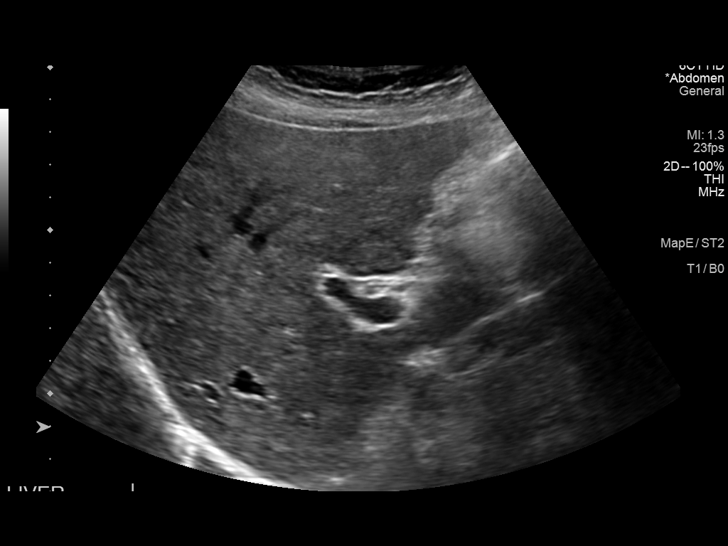
[im 60/60]
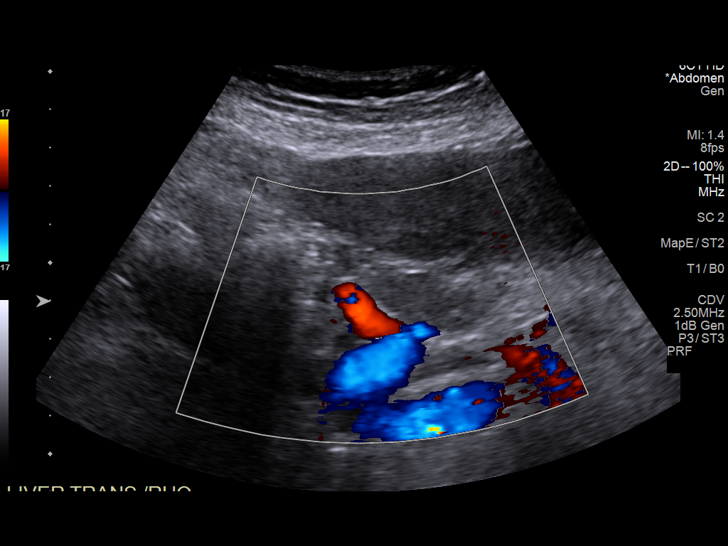

[14 of 25 positions shown; findings below may reference images not displayed]

FINDINGS: Gallbladder:

Normally distended without stones or wall thickening. No
pericholecystic fluid or sonographic Murphy sign.

Common bile duct:

Diameter: 2 mm, normal

Liver:

Slightly coarsened echogenicity. No focal hepatic mass lesion
identified. Portal vein is patent on color Doppler imaging with
normal direction of blood flow towards the liver.

Other: No RIGHT upper quadrant free fluid. Small periportal lymph
node identified, 10 mm short axis, normal.
IMPRESSION: Coarsened hepatic parenchymal echogenicity without focal mass.

No RIGHT upper quadrant sonographic abnormalities otherwise
identified.

## 2021-06-03 ENCOUNTER — Other Ambulatory Visit: Payer: Self-pay | Admitting: Podiatry

## 2021-06-03 NOTE — Telephone Encounter (Signed)
Please advise 

## 2021-08-06 ENCOUNTER — Other Ambulatory Visit: Payer: Self-pay | Admitting: Obstetrics & Gynecology

## 2021-08-06 DIAGNOSIS — Z1231 Encounter for screening mammogram for malignant neoplasm of breast: Secondary | ICD-10-CM

## 2021-09-09 ENCOUNTER — Ambulatory Visit
Admission: RE | Admit: 2021-09-09 | Discharge: 2021-09-09 | Disposition: A | Discharge status: Home / Self Care | Payer: Medicare HMO | Source: Ambulatory Visit | Attending: Obstetrics & Gynecology | Admitting: Obstetrics & Gynecology

## 2021-09-09 DIAGNOSIS — Z1231 Encounter for screening mammogram for malignant neoplasm of breast: Secondary | ICD-10-CM

## 2021-10-08 ENCOUNTER — Other Ambulatory Visit: Payer: Self-pay | Admitting: Podiatry

## 2022-02-19 ENCOUNTER — Other Ambulatory Visit: Payer: Self-pay | Admitting: Podiatry

## 2022-11-12 ENCOUNTER — Ambulatory Visit: Payer: Medicare HMO | Admitting: Podiatry

## 2022-11-12 ENCOUNTER — Ambulatory Visit (INDEPENDENT_AMBULATORY_CARE_PROVIDER_SITE_OTHER): Payer: Medicare HMO

## 2022-11-12 DIAGNOSIS — M79671 Pain in right foot: Secondary | ICD-10-CM | POA: Diagnosis not present

## 2022-11-12 DIAGNOSIS — L989 Disorder of the skin and subcutaneous tissue, unspecified: Secondary | ICD-10-CM

## 2022-11-12 MED ORDER — AMMONIUM LACTATE 12 % EX CREA: 1.0000 | TOPICAL_CREAM | CUTANEOUS | 1 refills | Status: AC | PRN

## 2022-11-14 NOTE — Progress Notes (Signed)
   Chief Complaint  Patient presents with   Foot Pain    Right foot ankle pain, started 2 months ago, patient is having pain on the lateral side of the ankle, x-rays taken today     HPI: 67 y.o. female presenting today for evaluation of pain and tenderness associated to the right posterior heel along some calluses that have slowly developed over time.  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  She the calluses have been hurting over the past month.  She presents for further treatment and evaluation  Past Medical History:  Diagnosis Date   Allergy    Endometriosis     Past Surgical History:  Procedure Laterality Date   APPENDECTOMY     KNEE ARTHROSCOPY Left    LAPAROSCOPY     endometriosis    Allergies  Allergen Reactions   Amoxicillin-Pot Clavulanate     Other reaction(s): Other (See Comments) Stomach pain   Ciprocin-Fluocin-Procin [Fluocinolone Acetonide] Hives   Latex Itching    01/16/2015 pt states she has an allergy to Latex, but this was not reported prior to this date.   Penicillins    Sulfonamide Derivatives    Ciprofloxacin Rash     Physical Exam: General: The patient is alert and oriented x3 in no acute distress.  Dermatology: Hyperkeratotic sensitive calluses noted to the posterior heel.  These calluses are secondary to wound dehiscence that developed postoperatively.  They are currently healed.  There is no open wounds.  Vascular: Palpable pedal pulses bilaterally. Capillary refill within normal limits.  Negative for any significant edema or erythema  Neurological: Light touch and protective threshold grossly intact  Musculoskeletal Exam: No pedal deformities noted.  There is some tenderness with palpation along the callus areas of the foot  Radiographic Exam RT foot and ankle 11/12/2022:  Orthopedic calcaneal plate and screws noted along the calcaneus.  There is some degenerative changes but no acute fracture identified.  No radiolucencies along the  hardware.  Overall it appears stable.  Assessment: 1. H/o ORIF calcaneus right 2.  Symptomatic calluses right posterior heel   Plan of Care:  1. Patient evaluated. X-Rays reviewed.  2.  Excisional debridement of the hyperkeratotic calluses was performed today using a 312 scalpel without incident or bleeding.  The patient did feel some relief 3.  Recommend good supportive shoes that do not irritate the posterior heel 4.  Return to clinic as needed      Edrick Kins, DPM [facility]  Dr. Edrick Kins, DPM    [address], [place] [address]                Office [phone]  Fax [phone]

## 2022-12-30 ENCOUNTER — Other Ambulatory Visit: Payer: Self-pay | Admitting: Internal Medicine

## 2022-12-30 DIAGNOSIS — K754 Autoimmune hepatitis: Secondary | ICD-10-CM

## 2022-12-30 DIAGNOSIS — K743 Primary biliary cirrhosis: Secondary | ICD-10-CM

## 2023-01-15 ENCOUNTER — Encounter: Payer: Self-pay | Admitting: Podiatry

## 2023-01-27 ENCOUNTER — Ambulatory Visit
Admission: RE | Admit: 2023-01-27 | Discharge: 2023-01-27 | Disposition: A | Discharge status: Home / Self Care | Payer: Medicare HMO | Source: Ambulatory Visit | Attending: Internal Medicine | Admitting: Internal Medicine

## 2023-01-27 DIAGNOSIS — K743 Primary biliary cirrhosis: Secondary | ICD-10-CM

## 2023-01-27 DIAGNOSIS — K754 Autoimmune hepatitis: Secondary | ICD-10-CM

## 2023-01-30 ENCOUNTER — Ambulatory Visit: Payer: Medicare HMO | Admitting: Podiatry

## 2023-01-30 ENCOUNTER — Ambulatory Visit (INDEPENDENT_AMBULATORY_CARE_PROVIDER_SITE_OTHER): Payer: Medicare HMO

## 2023-01-30 DIAGNOSIS — M7751 Other enthesopathy of right foot: Secondary | ICD-10-CM | POA: Diagnosis not present

## 2023-01-30 DIAGNOSIS — M79671 Pain in right foot: Secondary | ICD-10-CM

## 2023-01-30 DIAGNOSIS — R52 Pain, unspecified: Secondary | ICD-10-CM | POA: Diagnosis not present

## 2023-01-30 DIAGNOSIS — M19071 Primary osteoarthritis, right ankle and foot: Secondary | ICD-10-CM | POA: Diagnosis not present

## 2023-01-30 MED ORDER — BETAMETHASONE SOD PHOS & ACET 6 (3-3) MG/ML IJ SUSP
3.0000 mg | Freq: Once | INTRAMUSCULAR | Status: AC
Start: 2023-01-30 — End: 2023-01-30
  Administered 2023-01-30: 3 mg via INTRA_ARTICULAR

## 2023-01-30 MED ORDER — MELOXICAM 15 MG PO TABS: 15.0000 mg | ORAL_TABLET | Freq: Every day | ORAL | 1 refills | Status: AC

## 2023-01-30 NOTE — Progress Notes (Signed)
   Chief Complaint  Patient presents with   Bunions    Patient came in today for right foot bunion pain, started 3 months ago, rate of pain 8 out of 10 at the end of the day, sharp ache, X-Rays done today     HPI: 66 y.o. female presenting today for new complaint of pain and tenderness associated to the right great toe joint.  Gradual onset over the past few years.  Denies a history of injury.  Patient states that the pain in the toe has progressively gotten worse.  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  She has no pain or tenderness associated with posterior heel.  Past Medical History:  Diagnosis Date   Allergy    Endometriosis     Past Surgical History:  Procedure Laterality Date   APPENDECTOMY     KNEE ARTHROSCOPY Left    LAPAROSCOPY     endometriosis    Allergies  Allergen Reactions   Amoxicillin-Pot Clavulanate     Other reaction(s): Other (See Comments) Stomach pain   Ciprocin-Fluocin-Procin [Fluocinolone Acetonide] Hives   Latex Itching    01/16/2015 pt states she has an allergy to Latex, but this was not reported prior to this date.   Penicillins    Sulfonamide Derivatives    Ciprofloxacin Rash     Physical Exam: General: The patient is alert and oriented x3 in no acute distress.  Dermatology: Skin is warm, dry and supple bilateral lower extremities.   Vascular: Palpable pedal pulses bilaterally. Capillary refill within normal limits.  No appreciable edema.  No erythema.  Neurological: Grossly intact via light touch  Musculoskeletal Exam: No pedal deformities noted.  There is pain and tenderness with palpation range of motion of the first MTP of the right foot.  There is also some limited range of motion as well.  Radiographic Exam RT foot 01/30/2023:  Orthopedic hardware noted within the body of the calcaneus appears stable.  Unchanged since prior x-rays.  There is some degenerative changes noted with joint space narrowing to the first MTP of the right  foot consistent with hallux limitus/arthritis  Assessment/Plan of Care: 1.  Hallux limitus/arthritis RT foot -Injection of 0.5 cc Celestone Soluspan injection the first MTP right -Prescription for meloxicam 15 mg daily as needed -Recommend good supportive shoes that are stiff in the forefoot and limit range of motion to the great toe joint during ambulation  2. H/O ORIF calcaneus right. DOS: 12/22/2019  -Continues to do well with no pain or symptoms      Felecia Shelling, DPM [facility]  Dr. Felecia Shelling, DPM    [address] [place], [place] [address]                Office [phone]  Fax [phone]

## 2023-02-10 ENCOUNTER — Other Ambulatory Visit: Payer: Self-pay | Admitting: Podiatry

## 2023-02-10 DIAGNOSIS — M19071 Primary osteoarthritis, right ankle and foot: Secondary | ICD-10-CM

## 2023-02-10 DIAGNOSIS — M7751 Other enthesopathy of right foot: Secondary | ICD-10-CM

## 2023-02-10 DIAGNOSIS — M79671 Pain in right foot: Secondary | ICD-10-CM

## 2023-02-10 DIAGNOSIS — R52 Pain, unspecified: Secondary | ICD-10-CM

## 2023-02-26 IMAGING — MG MM DIGITAL SCREENING BILAT W/ TOMO AND CAD
8 series · 9 of 24 positions shown · non-contrast
Comparison: Previous exam(s).

CLINICAL DATA: Screening.

EXAM:
DIGITAL SCREENING BILATERAL MAMMOGRAM WITH TOMOSYNTHESIS AND CAD
TECHNIQUE: Bilateral screening digital craniocaudal and mediolateral oblique
mammograms were obtained. Bilateral screening digital breast
tomosynthesis was performed. The images were evaluated with
computer-aided detection.

[R CC synth-2D]
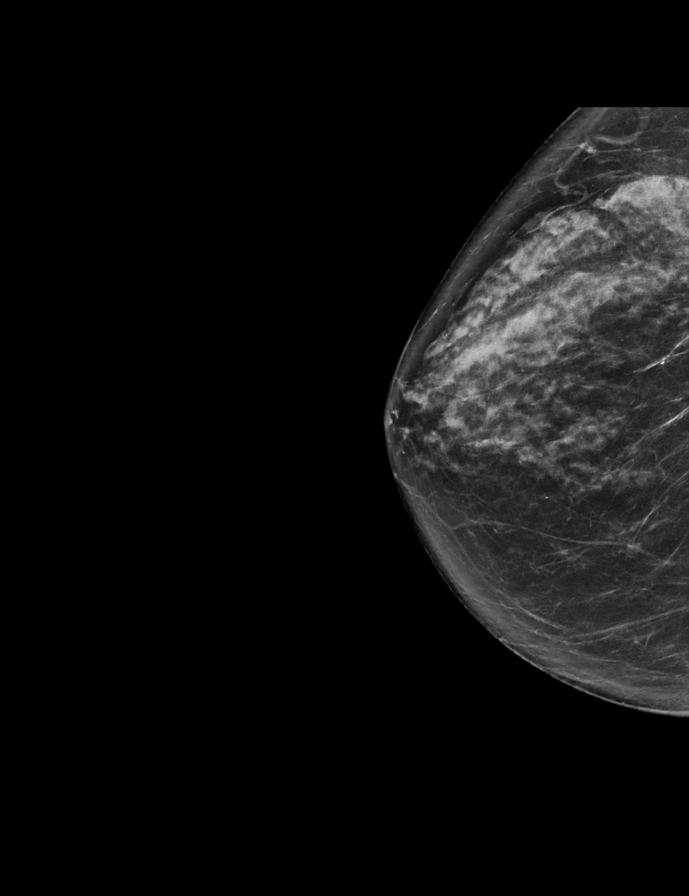

[R MLO synth-2D]
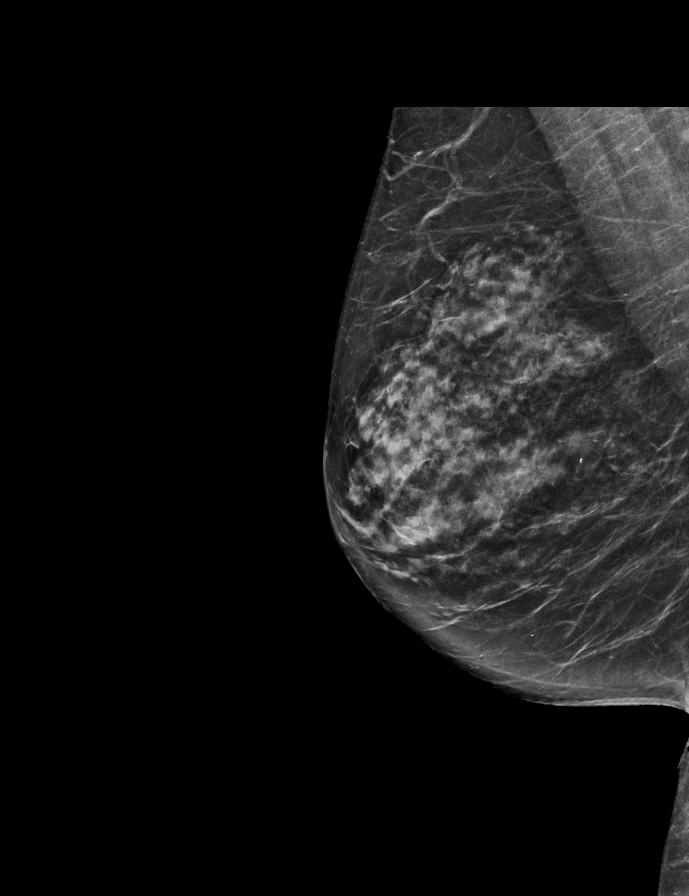

[L CC synth-2D]
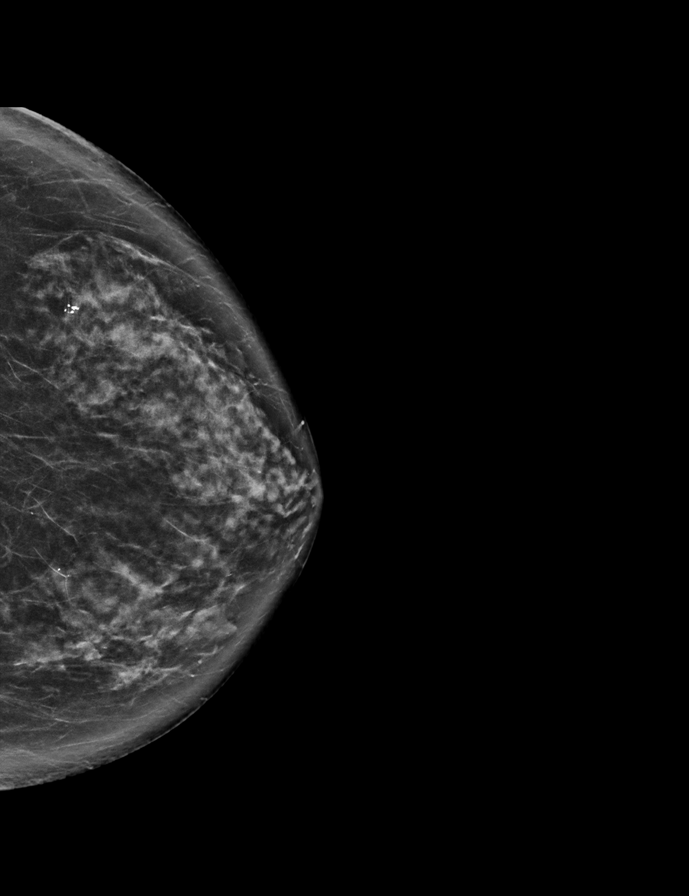

[L MLO synth-2D]
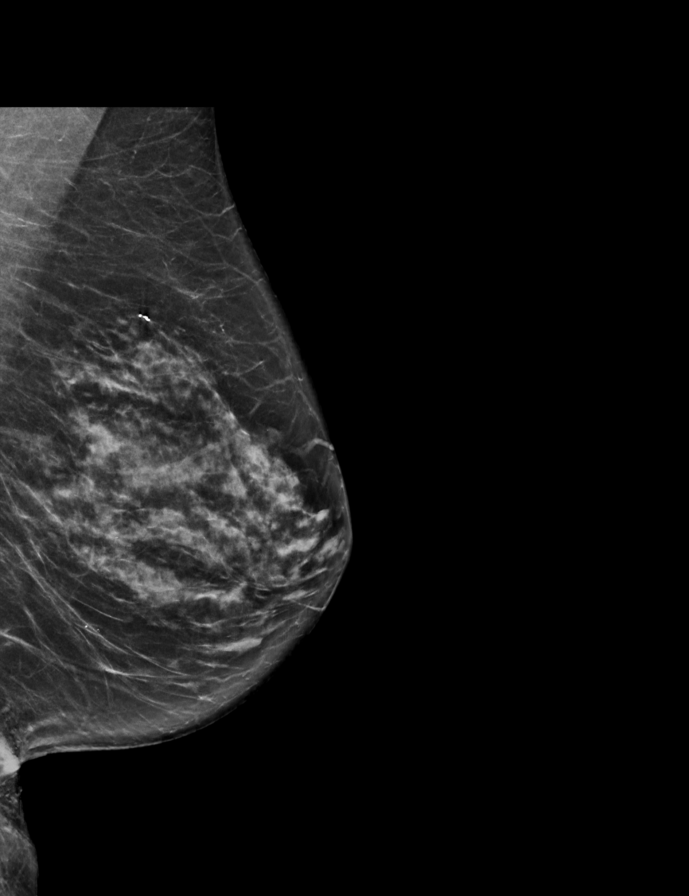

[R CC tomo · 2 of 64 frames shown]
[frame 21/64]
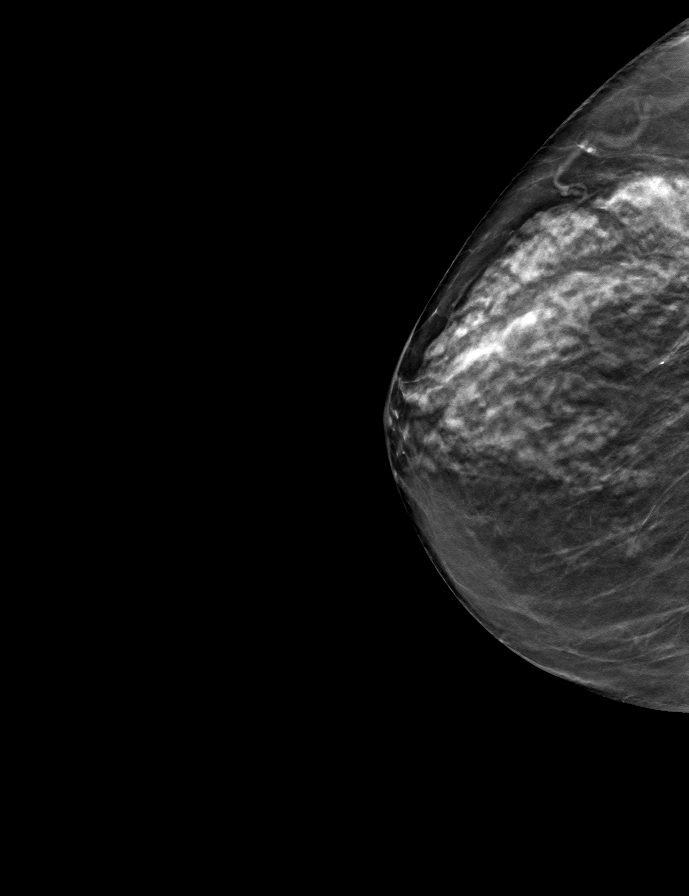
[frame 33/64]
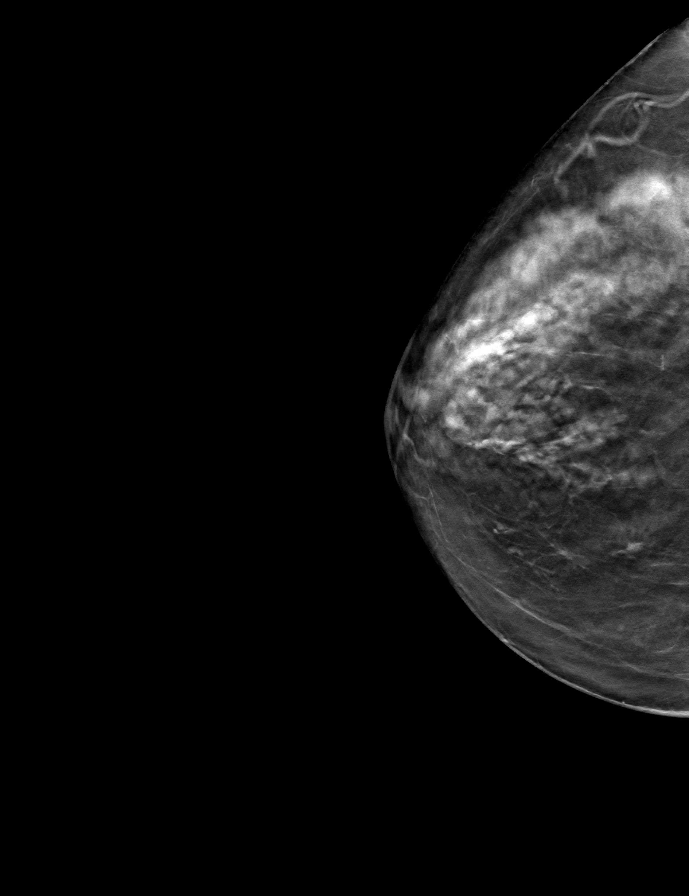

[R MLO tomo · tomo slice 33/65.0]
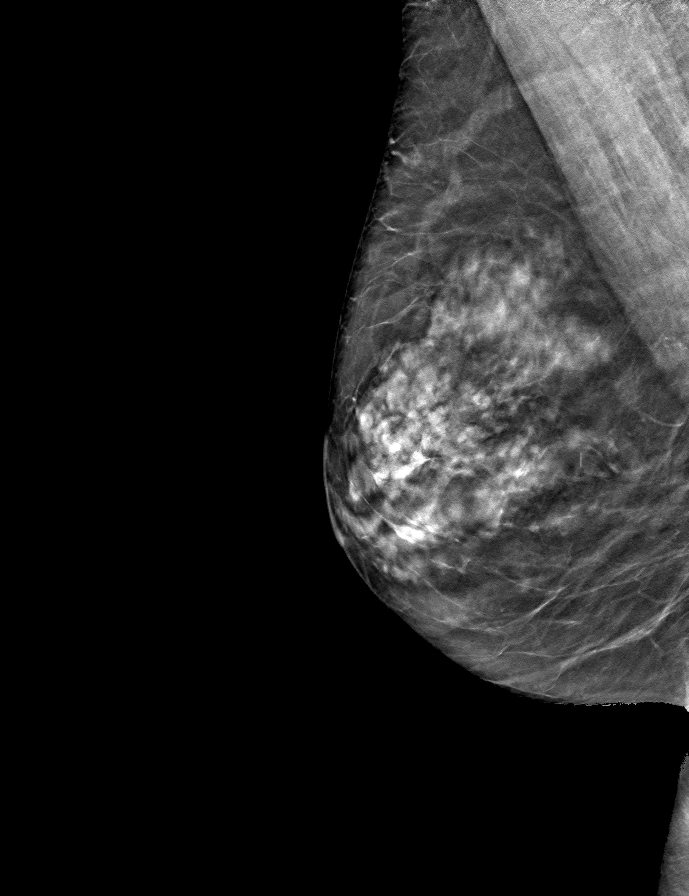

[L CC tomo · tomo slice 36/71.0]
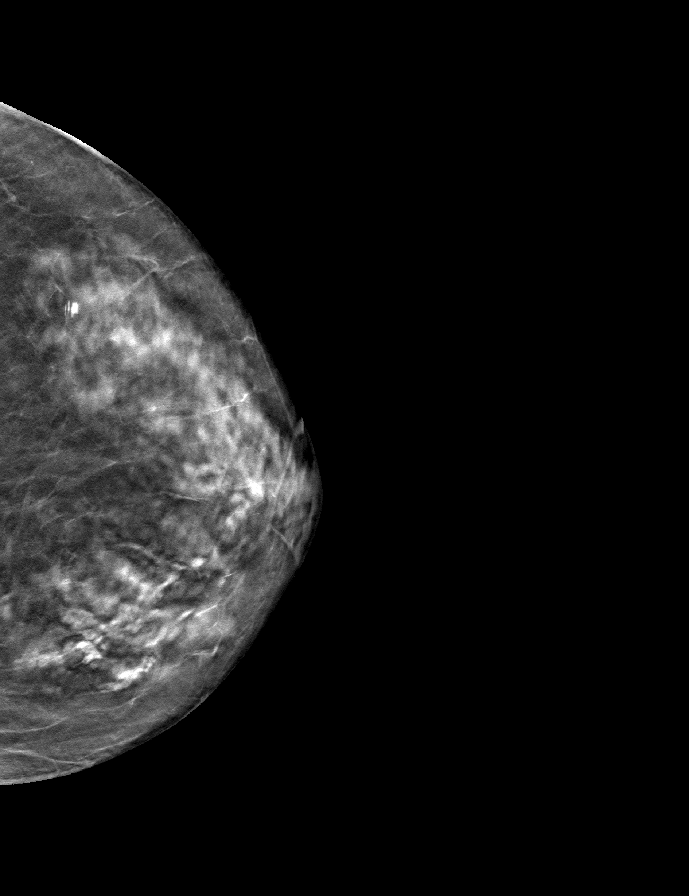

[L MLO tomo · tomo slice 33/65.0]
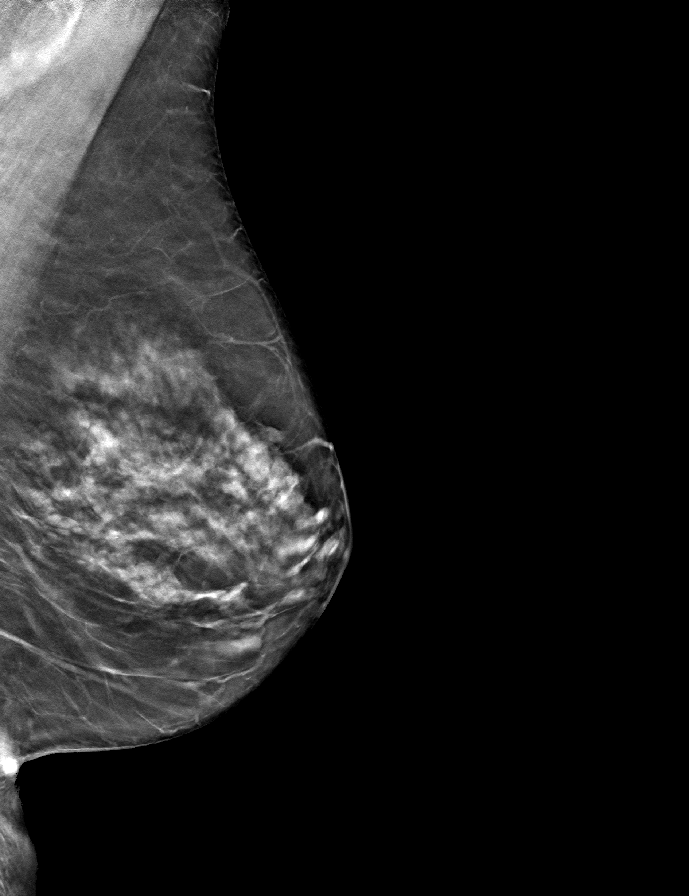

[9 of 24 positions shown; findings below may reference images not displayed]

ACR Breast Density Category c: The breast tissue is heterogeneously
dense, which may obscure small masses.
FINDINGS: There are no findings suspicious for malignancy.
IMPRESSION: No mammographic evidence of malignancy. A result letter of this
screening mammogram will be mailed directly to the patient.

RECOMMENDATION:
Screening mammogram in one year. (Code:Q3-W-BC3)

BI-RADS CATEGORY  1: Negative.

## 2023-05-12 ENCOUNTER — Encounter: Payer: Self-pay | Admitting: Podiatry

## 2023-10-07 ENCOUNTER — Other Ambulatory Visit: Payer: Self-pay | Admitting: Internal Medicine

## 2023-10-07 DIAGNOSIS — K754 Autoimmune hepatitis: Secondary | ICD-10-CM

## 2023-10-07 DIAGNOSIS — K743 Primary biliary cirrhosis: Secondary | ICD-10-CM

## 2023-10-14 ENCOUNTER — Other Ambulatory Visit: Payer: Medicare HMO

## 2023-10-16 ENCOUNTER — Ambulatory Visit
Admission: RE | Admit: 2023-10-16 | Discharge: 2023-10-16 | Disposition: A | Discharge status: Home / Self Care | Payer: Medicare HMO | Source: Ambulatory Visit | Attending: Internal Medicine

## 2023-10-16 DIAGNOSIS — K743 Primary biliary cirrhosis: Secondary | ICD-10-CM

## 2023-10-16 DIAGNOSIS — K754 Autoimmune hepatitis: Secondary | ICD-10-CM

## 2024-01-29 ENCOUNTER — Other Ambulatory Visit: Payer: Self-pay | Admitting: Obstetrics and Gynecology

## 2024-01-29 DIAGNOSIS — Z1231 Encounter for screening mammogram for malignant neoplasm of breast: Secondary | ICD-10-CM

## 2024-02-02 ENCOUNTER — Ambulatory Visit

## 2024-02-03 ENCOUNTER — Ambulatory Visit
Admission: RE | Admit: 2024-02-03 | Discharge: 2024-02-03 | Disposition: A | Discharge status: Home / Self Care | Source: Ambulatory Visit | Attending: Obstetrics and Gynecology | Admitting: Obstetrics and Gynecology

## 2024-02-03 DIAGNOSIS — Z1231 Encounter for screening mammogram for malignant neoplasm of breast: Secondary | ICD-10-CM

## 2024-04-11 ENCOUNTER — Other Ambulatory Visit: Payer: Self-pay | Admitting: Internal Medicine

## 2024-04-11 DIAGNOSIS — K743 Primary biliary cirrhosis: Secondary | ICD-10-CM

## 2024-04-15 ENCOUNTER — Ambulatory Visit
Admission: RE | Admit: 2024-04-15 | Discharge: 2024-04-15 | Disposition: A | Discharge status: Home / Self Care | Source: Ambulatory Visit | Attending: Internal Medicine | Admitting: Internal Medicine

## 2024-04-15 DIAGNOSIS — K743 Primary biliary cirrhosis: Secondary | ICD-10-CM

## 2024-08-24 ENCOUNTER — Other Ambulatory Visit: Payer: Self-pay | Admitting: Internal Medicine

## 2024-08-24 DIAGNOSIS — K743 Primary biliary cirrhosis: Secondary | ICD-10-CM

## 2024-09-09 ENCOUNTER — Other Ambulatory Visit

## 2024-11-04 ENCOUNTER — Inpatient Hospital Stay: Admission: RE | Admit: 2024-11-04

## 2024-11-04 DIAGNOSIS — K743 Primary biliary cirrhosis: Secondary | ICD-10-CM
